# Patient Record
Sex: Female | Born: 1960 | Race: White | Hispanic: No | Marital: Single | Smoking: Never smoker
Health system: Southern US, Community
[De-identification: ages and names within clinical notes are randomized; demographics above are authoritative.]

## PROBLEM LIST (undated history)

## (undated) HISTORY — PX: TONSILLECTOMY: SUR1361

## (undated) HISTORY — PX: BREAST SURGERY: SHX581

## (undated) HISTORY — PX: PELVIC LAPAROSCOPY: SHX162

---

## 2017-12-02 ENCOUNTER — Ambulatory Visit (INDEPENDENT_AMBULATORY_CARE_PROVIDER_SITE_OTHER): Payer: 59 | Admitting: Obstetrics & Gynecology

## 2017-12-02 ENCOUNTER — Encounter: Payer: Self-pay | Admitting: Obstetrics & Gynecology

## 2017-12-02 VITALS — BP 140/88 | Ht 61.75 in | Wt 137.0 lb

## 2017-12-02 DIAGNOSIS — Z01419 Encounter for gynecological examination (general) (routine) without abnormal findings: Secondary | ICD-10-CM | POA: Diagnosis not present

## 2017-12-02 DIAGNOSIS — R7989 Other specified abnormal findings of blood chemistry: Secondary | ICD-10-CM | POA: Diagnosis not present

## 2017-12-02 DIAGNOSIS — Z13820 Encounter for screening for osteoporosis: Secondary | ICD-10-CM

## 2017-12-02 DIAGNOSIS — Z780 Asymptomatic menopausal state: Secondary | ICD-10-CM

## 2017-12-02 NOTE — Progress Notes (Signed)
Jamie Huynh 1960-11-10 [id number]   History:    57 y.o. G7P0A7 Engaged  RP:  New patient presenting for annual gyn exam   HPI: Menopause x 6 yrs, on no HRT.  No PMB.  No pelvic pain.  No pain with IC.  Feels fatigued, thinning of hair, mildly hot and weight gain in spite of intense work out at [employer].  BMI 25.26.  Health Labs done 10/03/2017.  FT3 mildly decreased at 2.8, FT4 normal at 1.12 and TSH normal at 1.55.  Mother with h/o Breast Cancer.  No genetic screening done.  Breast normal currently.  Last mammogram benign, stable in November 2018.  Per patient has a marker in right breast for benign calcifications and had two biopsies showing fibrocystic breasts on the left side.  Urine and bowel movements normal.  Health labs with family physician.  Past medical history,surgical history, family history and social history were all reviewed and documented in the EPIC chart.  Gynecologic History No LMP recorded. Patient is postmenopausal. Contraception: post menopausal status Last Pap: 2 yrs ago. Results were: normal.  Never had an abnormal Pap Last mammogram: 07/2017. Results were: normal.  Stable per patient.  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  Left benign Fibrocystic lumps removed x 2. Bone Density: Never.  Will schedule here. Colonoscopy: Never.  Will organize with Fam MD.  Obstetric History OB History  Gravida Para Term Preterm AB Living  7 0     7    SAB TAB Ectopic Multiple Live Births  7            # Outcome Date GA Lbr Len/2nd Weight Sex Delivery Anes PTL Lv  7 SAB           6 SAB           5 SAB           4 SAB           3 SAB           2 SAB           1 SAB                ROS: A ROS was performed and pertinent positives and negatives are included in the history.  GENERAL: No fevers or chills. HEENT: No change in vision, no earache, sore throat or sinus congestion. NECK: No pain or stiffness. CARDIOVASCULAR: No chest pain or pressure. No palpitations. PULMONARY: No  shortness of breath, cough or wheeze. GASTROINTESTINAL: No abdominal pain, nausea, vomiting or diarrhea, melena or bright red blood per rectum. GENITOURINARY: No urinary frequency, urgency, hesitancy or dysuria. MUSCULOSKELETAL: No joint or muscle pain, no back pain, no recent trauma. DERMATOLOGIC: No rash, no itching, no lesions. ENDOCRINE: No polyuria, polydipsia, no heat or cold intolerance. No recent change in weight. HEMATOLOGICAL: No anemia or easy bruising or bleeding. NEUROLOGIC: No headache, seizures, numbness, tingling or weakness. PSYCHIATRIC: No depression, no loss of interest in normal activity or change in sleep pattern.     Exam:   Ht 5' 1.75" (1.568 m)   Wt 137 lb (62.1 kg)   BMI 25.26 kg/m   Body mass index is 25.26 kg/m.  General appearance : Well developed well nourished female. No acute distress HEENT: Eyes: no retinal hemorrhage or exudates,  Neck supple, trachea midline, no carotid bruits, no thyroidmegaly Lungs: Clear to auscultation, no rhonchi or wheezes, or rib retractions  Heart: Regular rate and rhythm, no  murmurs or gallops Breast:Examined in sitting and supine position were symmetrical in appearance, no palpable masses or tenderness,  no skin retraction, no nipple inversion, no nipple discharge, no skin discoloration, no axillary or supraclavicular lymphadenopathy Abdomen: no palpable masses or tenderness, no rebound or guarding Extremities: no edema or skin discoloration or tenderness  Pelvic: Vulva: Normal             Vagina: No gross lesions or discharge  Cervix: No gross lesions or discharge.  Pap reflex done.  Uterus  AV, normal size, shape and consistency, non-tender and mobile  Adnexa  Without masses or tenderness  Anus: Normal   Assessment/Plan:  57 y.o. female for annual exam   1. Encounter for routine gynecological examination with Papanicolaou smear of cervix Normal gynecologic exam.  Pap reflex done today.  Breast exam normal.  Last mammogram  November 2018.  Health labs with family physician.  Will organize screening colonoscopy with family physician.  2. Menopause present Well on no hormone replacement therapy, with only mild hot flushes.  No postmenopausal bleeding.  Hormone replacement therapy not recommended at this time, patient agrees completely because of her strong family history of breast cancer.,  Will call if decides to proceed.  Phytoestrogens recommended. - VITAMIN D 25 Hydroxy (Vit-D Deficiency, Fractures)  3. Screening for osteoporosis Vitamin D supplements, calcium rich nutrition and regular weightbearing physical activity recommended.  Will schedule bone density here now.  Recommend reading The healthy bones by [place]. - VITAMIN D 25 Hydroxy (Vit-D Deficiency, Fractures)  4. Abnormal thyroid blood test Referred to endocrinology to further evaluate.  Free T3 mildly low, but rest of the workup negative.  Patient has complaints of fatigue and thinning of hair and would therefore like a more complete workup patient offered referral for genetic screening.  Other orders - Multiple Vitamin (MULTIVITAMIN) tablet; Take 1 tablet by mouth daily. - Ascorbic Acid (VITAMIN C) 1000 MG tablet; Take 1,000 mg by mouth daily. - Omega-3 Fatty Acids (FISH OIL) 600 MG CAPS; Take by mouth. - cholecalciferol (VITAMIN D) 1000 units tablet; Take 1,000 Units by mouth daily. - Calcium-Magnesium-Vitamin D (CALCIUM 1200+D3 PO); Take by mouth. - vitamin E 400 UNIT capsule; Take 400 Units by mouth daily.  Counseling on above issues more than 50% for 20 minutes.  Genia DelMarie-Lyne Toyoko Silos MD, 11:39 AM 12/02/2017

## 2017-12-03 ENCOUNTER — Encounter: Payer: Self-pay | Admitting: Obstetrics & Gynecology

## 2017-12-03 ENCOUNTER — Telehealth: Payer: Self-pay | Admitting: *Deleted

## 2017-12-03 DIAGNOSIS — R7989 Other specified abnormal findings of blood chemistry: Secondary | ICD-10-CM

## 2017-12-03 LAB — VITAMIN D 25 HYDROXY (VIT D DEFICIENCY, FRACTURES): Vit D, 25-Hydroxy: 40 ng/mL (ref 30–100)

## 2017-12-03 NOTE — Patient Instructions (Signed)
1. Encounter for routine gynecological examination with Papanicolaou smear of cervix Normal gynecologic exam.  Pap reflex done today.  Breast exam normal.  Last mammogram November 2018.  Health labs with family physician.  Will organize screening colonoscopy with family physician.  2. Menopause present Well on no hormone replacement therapy, with only mild hot flushes.  No postmenopausal bleeding.  Hormone replacement therapy not recommended at this time, patient agrees completely because of her strong family history of breast cancer.,  Will call if decides to proceed.  Phytoestrogens recommended. - VITAMIN D 25 Hydroxy (Vit-D Deficiency, Fractures)  3. Screening for osteoporosis Vitamin D supplements, calcium rich nutrition and regular weightbearing physical activity recommended.  Will schedule bone density here now.  Recommend reading The healthy bones by Claiborne Billings in [place]. - VITAMIN D 25 Hydroxy (Vit-D Deficiency, Fractures)  4. Abnormal thyroid blood test Referred to endocrinology to further evaluate.  Free T3 mildly low, but rest of the workup negative.  Patient has complaints of fatigue and thinning of hair and would therefore like a more complete workup patient offered referral for genetic screening.  Other orders - Multiple Vitamin (MULTIVITAMIN) tablet; Take 1 tablet by mouth daily. - Ascorbic Acid (VITAMIN C) 1000 MG tablet; Take 1,000 mg by mouth daily. - Omega-3 Fatty Acids (FISH OIL) 600 MG CAPS; Take by mouth. - cholecalciferol (VITAMIN D) 1000 units tablet; Take 1,000 Units by mouth daily. - Calcium-Magnesium-Vitamin D (CALCIUM 1200+D3 PO); Take by mouth. - vitamin E 400 UNIT capsule; Take 400 Units by mouth daily.  Jamie Huynh, it was a pleasure meeting you today!  I will inform you of your results as soon as they are available.   Health Maintenance for Postmenopausal Women Menopause is a normal process in which your reproductive ability comes to an end. This process happens  gradually over a span of months to years, usually between the ages of 66 and 47. Menopause is complete when you have missed 12 consecutive menstrual periods. It is important to talk with your health care provider about some of the most common conditions that affect postmenopausal women, such as heart disease, cancer, and bone loss (osteoporosis). Adopting a healthy lifestyle and getting preventive care can help to promote your health and wellness. Those actions can also lower your chances of developing some of these common conditions. What should I know about menopause? During menopause, you may experience a number of symptoms, such as:  Moderate-to-severe hot flashes.  Night sweats.  Decrease in sex drive.  Mood swings.  Headaches.  Tiredness.  Irritability.  Memory problems.  Insomnia.  Choosing to treat or not to treat menopausal changes is an individual decision that you make with your health care provider. What should I know about hormone replacement therapy and supplements? Hormone therapy products are effective for treating symptoms that are associated with menopause, such as hot flashes and night sweats. Hormone replacement carries certain risks, especially as you become older. If you are thinking about using estrogen or estrogen with progestin treatments, discuss the benefits and risks with your health care provider. What should I know about heart disease and stroke? Heart disease, heart attack, and stroke become more likely as you age. This may be due, in part, to the hormonal changes that your body experiences during menopause. These can affect how your body processes dietary fats, triglycerides, and cholesterol. Heart attack and stroke are both medical emergencies. There are many things that you can do to help prevent heart disease and stroke:  Have your  blood pressure checked at least every 1-2 years. High blood pressure causes heart disease and increases the risk of  stroke.  If you are 18-33 years old, ask your health care provider if you should take aspirin to prevent a heart attack or a stroke.  Do not use any tobacco products, including cigarettes, chewing tobacco, or electronic cigarettes. If you need help quitting, ask your health care provider.  It is important to eat a healthy diet and maintain a healthy weight. ? Be sure to include plenty of vegetables, fruits, low-fat dairy products, and lean protein. ? Avoid eating foods that are high in solid fats, added sugars, or salt (sodium).  Get regular exercise. This is one of the most important things that you can do for your health. ? Try to exercise for at least 150 minutes each week. The type of exercise that you do should increase your heart rate and make you sweat. This is known as moderate-intensity exercise. ? Try to do strengthening exercises at least twice each week. Do these in addition to the moderate-intensity exercise.  Know your numbers.Ask your health care provider to check your cholesterol and your blood glucose. Continue to have your blood tested as directed by your health care provider.  What should I know about cancer screening? There are several types of cancer. Take the following steps to reduce your risk and to catch any cancer development as early as possible. Breast Cancer  Practice breast self-awareness. ? This means understanding how your breasts normally appear and feel. ? It also means doing regular breast self-exams. Let your health care provider know about any changes, no matter how small.  If you are 6 or older, have a clinician do a breast exam (clinical breast exam or CBE) every year. Depending on your age, family history, and medical history, it may be recommended that you also have a yearly breast X-ray (mammogram).  If you have a family history of breast cancer, talk with your health care provider about genetic screening.  If you are at high risk for breast  cancer, talk with your health care provider about having an MRI and a mammogram every year.  Breast cancer (BRCA) gene test is recommended for women who have family members with BRCA-related cancers. Results of the assessment will determine the need for genetic counseling and BRCA1 and for BRCA2 testing. BRCA-related cancers include these types: ? Breast. This occurs in males or females. ? Ovarian. ? Tubal. This may also be called fallopian tube cancer. ? Cancer of the abdominal or pelvic lining (peritoneal cancer). ? Prostate. ? Pancreatic.  Cervical, Uterine, and Ovarian Cancer Your health care provider may recommend that you be screened regularly for cancer of the pelvic organs. These include your ovaries, uterus, and vagina. This screening involves a pelvic exam, which includes checking for microscopic changes to the surface of your cervix (Pap test).  For women ages 21-65, health care providers may recommend a pelvic exam and a Pap test every three years. For women ages 50-65, they may recommend the Pap test and pelvic exam, combined with testing for human papilloma virus (HPV), every five years. Some types of HPV increase your risk of cervical cancer. Testing for HPV may also be done on women of any age who have unclear Pap test results.  Other health care providers may not recommend any screening for nonpregnant women who are considered low risk for pelvic cancer and have no symptoms. Ask your health care provider if a screening  pelvic exam is right for you.  If you have had past treatment for cervical cancer or a condition that could lead to cancer, you need Pap tests and screening for cancer for at least 20 years after your treatment. If Pap tests have been discontinued for you, your risk factors (such as having a new sexual partner) need to be reassessed to determine if you should start having screenings again. Some women have medical problems that increase the chance of getting cervical  cancer. In these cases, your health care provider may recommend that you have screening and Pap tests more often.  If you have a family history of uterine cancer or ovarian cancer, talk with your health care provider about genetic screening.  If you have vaginal bleeding after reaching menopause, tell your health care provider.  There are currently no reliable tests available to screen for ovarian cancer.  Lung Cancer Lung cancer screening is recommended for adults 43-36 years old who are at high risk for lung cancer because of a history of smoking. A yearly low-dose CT scan of the lungs is recommended if you:  Currently smoke.  Have a history of at least 30 pack-years of smoking and you currently smoke or have quit within the past 15 years. A pack-year is smoking an average of one pack of cigarettes per day for one year.  Yearly screening should:  Continue until it has been 15 years since you quit.  Stop if you develop a health problem that would prevent you from having lung cancer treatment.  Colorectal Cancer  This type of cancer can be detected and can often be prevented.  Routine colorectal cancer screening usually begins at age 28 and continues through age 79.  If you have risk factors for colon cancer, your health care provider may recommend that you be screened at an earlier age.  If you have a family history of colorectal cancer, talk with your health care provider about genetic screening.  Your health care provider may also recommend using home test kits to check for hidden blood in your stool.  A small camera at the end of a tube can be used to examine your colon directly (sigmoidoscopy or colonoscopy). This is done to check for the earliest forms of colorectal cancer.  Direct examination of the colon should be repeated every 5-10 years until age 20. However, if early forms of precancerous polyps or small growths are found or if you have a family history or genetic risk  for colorectal cancer, you may need to be screened more often.  Skin Cancer  Check your skin from head to toe regularly.  Monitor any moles. Be sure to tell your health care provider: ? About any new moles or changes in moles, especially if there is a change in a mole's shape or color. ? If you have a mole that is larger than the size of a pencil eraser.  If any of your family members has a history of skin cancer, especially at a young age, talk with your health care provider about genetic screening.  Always use sunscreen. Apply sunscreen liberally and repeatedly throughout the day.  Whenever you are outside, protect yourself by wearing long sleeves, pants, a wide-brimmed hat, and sunglasses.  What should I know about osteoporosis? Osteoporosis is a condition in which bone destruction happens more quickly than new bone creation. After menopause, you may be at an increased risk for osteoporosis. To help prevent osteoporosis or the bone fractures that can  happen because of osteoporosis, the following is recommended:  If you are 37-29 years old, get at least 1,000 mg of calcium and at least 600 mg of vitamin D per day.  If you are older than age 70 but younger than age 92, get at least 1,200 mg of calcium and at least 600 mg of vitamin D per day.  If you are older than age 28, get at least 1,200 mg of calcium and at least 800 mg of vitamin D per day.  Smoking and excessive alcohol intake increase the risk of osteoporosis. Eat foods that are rich in calcium and vitamin D, and do weight-bearing exercises several times each week as directed by your health care provider. What should I know about how menopause affects my mental health? Depression may occur at any age, but it is more common as you become older. Common symptoms of depression include:  Low or sad mood.  Changes in sleep patterns.  Changes in appetite or eating patterns.  Feeling an overall lack of motivation or enjoyment of  activities that you previously enjoyed.  Frequent crying spells.  Talk with your health care provider if you think that you are experiencing depression. What should I know about immunizations? It is important that you get and maintain your immunizations. These include:  Tetanus, diphtheria, and pertussis (Tdap) booster vaccine.  Influenza every year before the flu season begins.  Pneumonia vaccine.  Shingles vaccine.  Your health care provider may also recommend other immunizations. This information is not intended to replace advice given to you by your health care provider. Make sure you discuss any questions you have with your health care provider. Document Released: 11/02/2005 Document Revised: 03/30/2016 Document Reviewed: 06/14/2015 Elsevier Interactive Patient Education  2018 [employer].

## 2017-12-03 NOTE — Telephone Encounter (Signed)
-----   Message from Genia DelMarie-Lyne Lavoie, MD sent at 12/02/2017 12:28 PM EDT ----- Regarding: Refer to Endocrinologist Fatigue, thinning of hair, weight gain with FT3 mildly low at 2.8.  Refer to Endocrinologist.

## 2017-12-03 NOTE — Telephone Encounter (Signed)
Referral placed at  [facility],they will call pt to schedule.

## 2017-12-04 LAB — PAP IG W/ RFLX HPV ASCU

## 2018-01-17 NOTE — Telephone Encounter (Signed)
[facility] declined referral per Dr. Lucianne MussKumar states after reviewing notes it doesn't appear to be an endocrinology issue. What to recommend to patient? Please advise

## 2018-01-20 NOTE — Telephone Encounter (Signed)
Recommend to see her Family MD.

## 2018-01-21 NOTE — Telephone Encounter (Signed)
Left detailed message on cell per DPR access. 

## 2019-01-19 ENCOUNTER — Other Ambulatory Visit: Payer: Self-pay

## 2019-01-21 ENCOUNTER — Encounter: Payer: Self-pay | Admitting: Obstetrics & Gynecology

## 2019-01-21 ENCOUNTER — Ambulatory Visit (INDEPENDENT_AMBULATORY_CARE_PROVIDER_SITE_OTHER): Payer: 59 | Admitting: Obstetrics & Gynecology

## 2019-01-21 ENCOUNTER — Other Ambulatory Visit: Payer: Self-pay

## 2019-01-21 VITALS — BP 150/96 | Ht 61.75 in | Wt 133.0 lb

## 2019-01-21 DIAGNOSIS — Z780 Asymptomatic menopausal state: Secondary | ICD-10-CM

## 2019-01-21 DIAGNOSIS — Z01419 Encounter for gynecological examination (general) (routine) without abnormal findings: Secondary | ICD-10-CM

## 2019-01-21 DIAGNOSIS — I10 Essential (primary) hypertension: Secondary | ICD-10-CM

## 2019-01-21 NOTE — Progress Notes (Signed)
Jamie Huynh 10-Nov-1960 [id number]   History:    58 y.o. G7P0A7 Single (Broke up with fiance)  RP:  Established patient presenting for annual gyn exam   HPI: Menopause, well on no HRT.  No PMB.  No pelvic pain.  Abstinent currently.  Urine and bowel movements normal.  Breast normal.  Body mass index 24.58.  We will follow-up here for fasting health labs.  Past medical history,surgical history, family history and social history were all reviewed and documented in the EPIC chart.  Gynecologic History No LMP recorded. Patient is postmenopausal. Contraception: post menopausal status Last Pap: 11/2017. Results were: Negative Last mammogram: 2018. Results were: normal.  Will schedule screening Mammo now. Bone Density: Never Colonoscopy: Will organize a colonoscopy.  Obstetric History OB History  Gravida Para Term Preterm AB Living  7 0     7    SAB TAB Ectopic Multiple Live Births  7            # Outcome Date GA Lbr Len/2nd Weight Sex Delivery Anes PTL Lv  7 SAB           6 SAB           5 SAB           4 SAB           3 SAB           2 SAB           1 SAB              ROS: A ROS was performed and pertinent positives and negatives are included in the history.  GENERAL: No fevers or chills. HEENT: No change in vision, no earache, sore throat or sinus congestion. NECK: No pain or stiffness. CARDIOVASCULAR: No chest pain or pressure. No palpitations. PULMONARY: No shortness of breath, cough or wheeze. GASTROINTESTINAL: No abdominal pain, nausea, vomiting or diarrhea, melena or bright red blood per rectum. GENITOURINARY: No urinary frequency, urgency, hesitancy or dysuria. MUSCULOSKELETAL: No joint or muscle pain, no back pain, no recent trauma. DERMATOLOGIC: No rash, no itching, no lesions. ENDOCRINE: No polyuria, polydipsia, no heat or cold intolerance. No recent change in weight. HEMATOLOGICAL: No anemia or easy bruising or bleeding. NEUROLOGIC: No headache, seizures, numbness,  tingling or weakness. PSYCHIATRIC: No depression, no loss of interest in normal activity or change in sleep pattern.     Exam:   BP (!) 150/96   Ht 5' 1.75" (1.568 m)   Wt 133 lb (60.3 kg)   BMI 24.52 kg/m   Body mass index is 24.52 kg/m.  General appearance : Well developed well nourished female. No acute distress HEENT: Eyes: no retinal hemorrhage or exudates,  Neck supple, trachea midline, no carotid bruits, no thyroidmegaly Lungs: Clear to auscultation, no rhonchi or wheezes, or rib retractions  Heart: Regular rate and rhythm, no murmurs or gallops Breast:Examined in sitting and supine position were symmetrical in appearance, no palpable masses or tenderness,  no skin retraction, no nipple inversion, no nipple discharge, no skin discoloration, no axillary or supraclavicular lymphadenopathy Abdomen: no palpable masses or tenderness, no rebound or guarding Extremities: no edema or skin discoloration or tenderness  Pelvic: Vulva: Normal             Vagina: No gross lesions or discharge  Cervix: No gross lesions or discharge.  Pap reflex done.  Uterus  AV, normal size, shape and consistency, non-tender and mobile  Adnexa  Without masses or tenderness  Anus: Normal   Assessment/Plan:  58 y.o. female for annual exam   1. Encounter for routine gynecological examination with Papanicolaou smear of cervix Normal gynecologic exam.  Pap reflex done.  Breast exam normal.  Schedule screening mammogram now.  Schedule screening colonoscopy.  Follow-up here for fasting health labs.  Good body mass index at 24.52.  Recommend increasing physical activities. - CBC; Future - Comp Met (CMET); Future - TSH; Future - Lipid panel; Future - VITAMIN D 25 Hydroxy (Vit-D Deficiency, Fractures); Future  2. Menopause Well on no hormone replacement therapy.  No postmenopausal bleeding.  Recommend vitamin D supplements with calcium intake of 1500 mg daily and regular weightbearing physical activities.   3. Hypertension, unspecified type High blood pressure today at 150/96.  Recommend to recheck blood pressure and if remains high will need a referral to a family physician.  Low-salt diet recommended.  Princess Bruins MD, 1:16 PM 01/21/2019

## 2019-01-22 ENCOUNTER — Encounter: Payer: Self-pay | Admitting: Obstetrics & Gynecology

## 2019-01-22 LAB — PAP IG W/ RFLX HPV ASCU

## 2019-01-22 NOTE — Patient Instructions (Addendum)
1. Encounter for routine gynecological examination with Papanicolaou smear of cervix Normal gynecologic exam.  Pap reflex done.  Breast exam normal.  Schedule screening mammogram now.  Schedule screening colonoscopy.  Follow-up here for fasting health labs.  Good body mass index at 24.52.  Recommend increasing physical activities. - CBC; Future - Comp Met (CMET); Future - TSH; Future - Lipid panel; Future - VITAMIN D 25 Hydroxy (Vit-D Deficiency, Fractures); Future  2. Menopause Well on no hormone replacement therapy.  No postmenopausal bleeding.  Recommend vitamin D supplements with calcium intake of 1500 mg daily and regular weightbearing physical activities.  3. Hypertension, unspecified type High blood pressure today at 150/96.  Recommend to recheck blood pressure and if remains high will need a referral to a family physician.  Low-salt diet recommended.  Jamie Huynh, it was a pleasure seeing you today!  I will inform you of your results as soon as they are available.

## 2020-02-09 ENCOUNTER — Telehealth: Payer: Self-pay | Admitting: *Deleted

## 2020-02-09 DIAGNOSIS — Z13220 Encounter for screening for lipoid disorders: Secondary | ICD-10-CM

## 2020-02-09 DIAGNOSIS — Z01419 Encounter for gynecological examination (general) (routine) without abnormal findings: Secondary | ICD-10-CM

## 2020-02-09 DIAGNOSIS — Z1329 Encounter for screening for other suspected endocrine disorder: Secondary | ICD-10-CM

## 2020-02-09 DIAGNOSIS — Z1321 Encounter for screening for nutritional disorder: Secondary | ICD-10-CM

## 2020-02-09 NOTE — Telephone Encounter (Signed)
Yes, agree

## 2020-02-09 NOTE — Telephone Encounter (Signed)
Patient called requesting referral from [facility], report last year she had labs drawn at [facility] with abnormal TSH level,( labs scanned into chart)  has noticed hair loss and increase weight gain. Okay to place referral?

## 2020-02-10 NOTE — Telephone Encounter (Signed)
Left message for patient to call.

## 2020-02-10 NOTE — Telephone Encounter (Addendum)
I discussed with patient [facility] labs are [facility] will need abnormal labs for referral. Patinet thought she received labs with PCP, that were abnormal . Per office visit on 01/21/19 annual labs in visit, but never ordered. I told patient I will place labs orders and she needs to schedule annual exam and we can use these orders for annual exam this year. Patient will call to schedule lab appointment. Patient verbalized she understood and will call me when she is ready for referral.

## 2020-02-15 ENCOUNTER — Other Ambulatory Visit: Payer: 59

## 2020-02-15 ENCOUNTER — Other Ambulatory Visit: Payer: Self-pay

## 2020-02-15 ENCOUNTER — Other Ambulatory Visit: Payer: Self-pay | Admitting: Anesthesiology

## 2020-02-15 DIAGNOSIS — Z1329 Encounter for screening for other suspected endocrine disorder: Secondary | ICD-10-CM

## 2020-02-15 DIAGNOSIS — L659 Nonscarring hair loss, unspecified: Secondary | ICD-10-CM

## 2020-02-15 DIAGNOSIS — Z13220 Encounter for screening for lipoid disorders: Secondary | ICD-10-CM

## 2020-02-15 DIAGNOSIS — R635 Abnormal weight gain: Secondary | ICD-10-CM

## 2020-02-15 DIAGNOSIS — Z01419 Encounter for gynecological examination (general) (routine) without abnormal findings: Secondary | ICD-10-CM

## 2020-02-15 DIAGNOSIS — Z1321 Encounter for screening for nutritional disorder: Secondary | ICD-10-CM

## 2020-02-15 LAB — COMPREHENSIVE METABOLIC PANEL
AG Ratio: 2.2 (calc) (ref 1.0–2.5)
ALT: 17 U/L (ref 6–29)
AST: 17 U/L (ref 10–35)
Albumin: 4.6 g/dL (ref 3.6–5.1)
Alkaline phosphatase (APISO): 78 U/L (ref 37–153)
BUN: 18 mg/dL (ref 7–25)
CO2: 26 mmol/L (ref 20–32)
Calcium: 9.8 mg/dL (ref 8.6–10.4)
Chloride: 107 mmol/L (ref 98–110)
Creat: 0.62 mg/dL (ref 0.50–0.99)
Globulin: 2.1 g/dL (calc) (ref 1.9–3.7)
Glucose, Bld: 96 mg/dL (ref 65–99)
Potassium: 4.4 mmol/L (ref 3.5–5.3)
Sodium: 140 mmol/L (ref 135–146)
Total Bilirubin: 0.5 mg/dL (ref 0.2–1.2)
Total Protein: 6.7 g/dL (ref 6.1–8.1)

## 2020-02-15 LAB — CBC
HCT: 41.7 % (ref 35.0–45.0)
Hemoglobin: 13.6 g/dL (ref 11.7–15.5)
MCH: 30 pg (ref 27.0–33.0)
MCHC: 32.6 g/dL (ref 32.0–36.0)
MCV: 92.1 fL (ref 80.0–100.0)
MPV: 10.6 fL (ref 7.5–12.5)
Platelets: 251 10*3/uL (ref 140–400)
RBC: 4.53 10*6/uL (ref 3.80–5.10)
RDW: 13.1 % (ref 11.0–15.0)
WBC: 4.9 10*3/uL (ref 3.8–10.8)

## 2020-02-15 LAB — VITAMIN D 25 HYDROXY (VIT D DEFICIENCY, FRACTURES): Vit D, 25-Hydroxy: 25 ng/mL — ABNORMAL LOW (ref 30–100)

## 2020-02-15 LAB — LIPID PANEL
Cholesterol: 227 mg/dL — ABNORMAL HIGH (ref <200)
HDL: 96 mg/dL (ref >=50)
LDL Cholesterol (Calc): 116 mg/dL (calc) — ABNORMAL HIGH
Non-HDL Cholesterol (Calc): 131 mg/dL (calc) — ABNORMAL HIGH (ref <130)
Total CHOL/HDL Ratio: 2.4 (calc) (ref <5.0)
Triglycerides: 55 mg/dL (ref <150)

## 2020-02-15 LAB — TSH: TSH: 0.61 mIU/L (ref 0.40–4.50)

## 2020-02-19 LAB — CORTISOL: Cortisol, Plasma: 8.1 ug/dL

## 2020-02-19 LAB — DIHYDROTESTOSTERONE: Dihydrotestosterone LC/MS/MS: <5 ng/dL (ref <=20)

## 2020-07-11 ENCOUNTER — Encounter: Payer: Self-pay | Admitting: Obstetrics & Gynecology

## 2020-07-11 ENCOUNTER — Other Ambulatory Visit: Payer: Self-pay

## 2020-07-11 ENCOUNTER — Ambulatory Visit (INDEPENDENT_AMBULATORY_CARE_PROVIDER_SITE_OTHER): Payer: 59 | Admitting: Obstetrics & Gynecology

## 2020-07-11 VITALS — BP 130/86 | Ht 63.0 in | Wt 130.0 lb

## 2020-07-11 DIAGNOSIS — Z01419 Encounter for gynecological examination (general) (routine) without abnormal findings: Secondary | ICD-10-CM | POA: Diagnosis not present

## 2020-07-11 DIAGNOSIS — Z13820 Encounter for screening for osteoporosis: Secondary | ICD-10-CM

## 2020-07-11 DIAGNOSIS — Z780 Asymptomatic menopausal state: Secondary | ICD-10-CM

## 2020-07-11 NOTE — Progress Notes (Signed)
Jamie Huynh November 05, 1960 [id number]   History:    59 y.o. G7P0A7 Single.  Parents in [place], would like to move there.  Working from home.  RP:  Established patient presenting for annual gyn exam   HPI: Postmenopause, well on no HRT.  No PMB.  No pelvic pain.  Abstinent currently.  Urine and bowel movements normal.  Breast normal.  Body mass index 23.03.  Will follow-up here for fasting Health labs in 01/2021.  Planning to do Cologard.  F/U BD here.  Past medical history,surgical history, family history and social history were all reviewed and documented in the EPIC chart.  Gynecologic History No LMP recorded. Patient is postmenopausal.  Obstetric History OB History  Gravida Para Term Preterm AB Living  7 0     7    SAB TAB Ectopic Multiple Live Births  7            # Outcome Date GA Lbr Len/2nd Weight Sex Delivery Anes PTL Lv  7 SAB           6 SAB           5 SAB           4 SAB           3 SAB           2 SAB           1 SAB              ROS: A ROS was performed and pertinent positives and negatives are included in the history.  GENERAL: No fevers or chills. HEENT: No change in vision, no earache, sore throat or sinus congestion. NECK: No pain or stiffness. CARDIOVASCULAR: No chest pain or pressure. No palpitations. PULMONARY: No shortness of breath, cough or wheeze. GASTROINTESTINAL: No abdominal pain, nausea, vomiting or diarrhea, melena or bright red blood per rectum. GENITOURINARY: No urinary frequency, urgency, hesitancy or dysuria. MUSCULOSKELETAL: No joint or muscle pain, no back pain, no recent trauma. DERMATOLOGIC: No rash, no itching, no lesions. ENDOCRINE: No polyuria, polydipsia, no heat or cold intolerance. No recent change in weight. HEMATOLOGICAL: No anemia or easy bruising or bleeding. NEUROLOGIC: No headache, seizures, numbness, tingling or weakness. PSYCHIATRIC: No depression, no loss of interest in normal activity or change in sleep pattern.      Exam:   BP 130/86   Ht 5\' 3"  (1.6 m)   Wt 130 lb (59 kg)   BMI 23.03 kg/m   Body mass index is 23.03 kg/m.  General appearance : Well developed well nourished female. No acute distress HEENT: Eyes: no retinal hemorrhage or exudates,  Neck supple, trachea midline, no carotid bruits, no thyroidmegaly Lungs: Clear to auscultation, no rhonchi or wheezes, or rib retractions  Heart: Regular rate and rhythm, no murmurs or gallops Breast:Examined in sitting and supine position were symmetrical in appearance, no palpable masses or tenderness,  no skin retraction, no nipple inversion, no nipple discharge, no skin discoloration, no axillary or supraclavicular lymphadenopathy Abdomen: no palpable masses or tenderness, no rebound or guarding Extremities: no edema or skin discoloration or tenderness  Pelvic: Vulva: Normal             Vagina: No gross lesions or discharge  Cervix: No gross lesions or discharge  Uterus  AV, normal size, shape and consistency, non-tender and mobile  Adnexa  Without masses or tenderness  Anus: Normal   Assessment/Plan:  59 y.o. female for  annual exam   1. Well female exam with routine gynecological exam Normal gynecologic exam in menopause.  No indication to repeat a Pap test this year, Pap test neg 12/2018.  Breast exam normal.  Screening mammogram October 2021 was negative.  Will do Cologuard now.  Follow-up here for fasting health labs May 2022 at 1 year.  Good body mass index at 23.03.  Continue with fitness and healthy nutrition.  2. Postmenopause Well on no hormone replacement therapy.  No postmenopausal bleeding.  3. Screening for osteoporosis Will follow-up here for her first bone density.  Vitamin D supplements, calcium intake of 1500 mg daily and regular weightbearing physical activities. - DG Bone Density; Future  [name], 4:02 PM 07/11/2020

## 2020-11-07 NOTE — Progress Notes (Signed)
I, Christoper Fabian, LAT, ATC, am serving as scribe for Dr. Clementeen Graham.  Subjective:    CC: Right ankle pain  HPI: Pt is a 60 y/o female c/o R ankle pain x 18 months after she began walking for exercise, approximately 20 miles/week.  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  She had a steroid injection and was advised to use a boot for 2 weeks.  Pt locates R ankle pain to her R lateral, distal lower leg, lateral ankle and lateral foot.  She's not interested in injections or surgery.  Radiating pain: yes into her lateral, distal lower leg and lateral foot R ankle swelling: yes Aggravates: prolonged walking; R ankle AROM (at times) Rx tried: podiatry w/ a steroid injection; chiro; different shoes  Pertinent review of Systems: No fevers or chills  Relevant historical information: Palpitations   Objective:    Vitals:   11/08/20 1503  BP: 140/90  Pulse: 79  SpO2: 98%   General: Well Developed, well nourished, and in no acute distress.   MSK: Right foot and ankle.  Cavus foot otherwise normal-appearing Tender palpation at ATFL region.   Tender palpation at the course of the peroneal tendon just inferior to lateral malleolus. Decreased ankle motion Pain resisted foot eversion. Stable ligamentous exam. Pulses cap refill and sensation are intact distally.  Lab and Radiology Results  X-ray images obtained today of the right ankle personally and independently interpreted DJD lateral ankle.  No acute fractures visible. Await formal radiology review  Diagnostic Limited MSK Ultrasound of: Right ankle DJD narrowing lateral ankle with a small joint effusion. Significant tenosynovitis and peroneal tendon especially at area inferior to bilateral malleolus.  The peroneal longus tendon appears to be disrupted with partial tendon tear.  No full-thickness complete tear of the peroneal tendon visible. Impression: Lateral ankle DJD and  peroneal tenosynovitis with partial tear    Impression and Recommendations:    Assessment and Plan: 60 y.o. female with right lateral ankle pain.  Primarily due to DJD and peroneal tenosynovitis and partial tear.  Patient also has a cavus foot which puts her at increased risk for injury.  Plan for home exercise program and physical therapy.  Also recommend Voltaren gel and compressive ankle sleeve.  Recheck in 6 weeks.  Consider MRI in future if not better.Marland Kitchen  PDMP not reviewed this encounter. Orders Placed This Encounter  Procedures  . DG Ankle Complete Right    Standing Status:   Future    Number of Occurrences:   1    Standing Expiration Date:   12/06/2020    Order Specific Question:   Reason for Exam (SYMPTOM  OR DIAGNOSIS REQUIRED)    Answer:   R ankle pain    Order Specific Question:   Preferred imaging location?    Answer:   [facility]  . [place] LIMITED JOINT SPACE STRUCTURES LOW RIGHT(NO LINKED CHARGES)    Order Specific Question:   Reason for Exam (SYMPTOM  OR DIAGNOSIS REQUIRED)    Answer:   R ankle pain    Order Specific Question:   Preferred imaging location?    Answer:   Adult nurse [facility]  . Ambulatory referral to Physical Therapy    Referral Priority:   Routine    Referral Type:   Physical Medicine    Referral Reason:   Specialty Services Required    Requested Specialty:   Physical Therapy  No orders of the defined types were placed in this encounter.   Discussed warning signs or symptoms. Please see discharge instructions. Patient expresses understanding.   The above documentation has been reviewed and is accurate and complete Lynne Leader, M.D.

## 2020-11-08 ENCOUNTER — Other Ambulatory Visit: Payer: Self-pay

## 2020-11-08 ENCOUNTER — Encounter: Payer: Self-pay | Admitting: Family Medicine

## 2020-11-08 ENCOUNTER — Ambulatory Visit: Payer: Self-pay

## 2020-11-08 ENCOUNTER — Ambulatory Visit (INDEPENDENT_AMBULATORY_CARE_PROVIDER_SITE_OTHER): Payer: 59 | Admitting: Family Medicine

## 2020-11-08 ENCOUNTER — Ambulatory Visit (INDEPENDENT_AMBULATORY_CARE_PROVIDER_SITE_OTHER): Payer: 59

## 2020-11-08 VITALS — BP 140/90 | HR 79 | Ht 63.0 in | Wt 132.2 lb

## 2020-11-08 DIAGNOSIS — M25571 Pain in right ankle and joints of right foot: Secondary | ICD-10-CM

## 2020-11-08 DIAGNOSIS — G8929 Other chronic pain: Secondary | ICD-10-CM

## 2020-11-08 DIAGNOSIS — M7671 Peroneal tendinitis, right leg: Secondary | ICD-10-CM | POA: Diagnosis not present

## 2020-11-08 NOTE — Patient Instructions (Addendum)
Thank you for coming in today.  Please use voltaren gel up to 4x daily for pain as needed.   Please perform the exercise program that we have prepared for you and gone over in detail on a daily basis.  In addition to the handout you were provided you can access your program through: [email or web address]   Your unique program code is: [email or web address]   I've referred you to Physical Therapy.  Let us know if you don't hear from them in one week.  I recommend you obtained a compression sleeve to help with your joint problems. There are many options on the market however I recommend obtaining a full ankle Body Helix compression sleeve.  You can find information (including how to appropriate measure yourself for sizing) can be found at www.Body Grand[employer].ch.  Many of these products are health savings account (HSA) eligible.   You can use the compression sleeve at any time throughout the day but is most important to use while being active as well as for 2 hours post-activity.   It is appropriate to ice following activity with the compression sleeve in place.  Recheck in 6 weeks.   Let me know if you have a problem.

## 2020-11-10 NOTE — Progress Notes (Signed)
X-ray right ankle shows evidence of prior old fractures but no current fracture.

## 2020-11-15 ENCOUNTER — Ambulatory Visit (INDEPENDENT_AMBULATORY_CARE_PROVIDER_SITE_OTHER): Payer: 59 | Admitting: Physical Therapy

## 2020-11-15 ENCOUNTER — Encounter: Payer: Self-pay | Admitting: Physical Therapy

## 2020-11-15 ENCOUNTER — Other Ambulatory Visit: Payer: Self-pay

## 2020-11-15 DIAGNOSIS — M25571 Pain in right ankle and joints of right foot: Secondary | ICD-10-CM | POA: Diagnosis not present

## 2020-11-15 DIAGNOSIS — M25471 Effusion, right ankle: Secondary | ICD-10-CM

## 2020-11-15 DIAGNOSIS — M6281 Muscle weakness (generalized): Secondary | ICD-10-CM | POA: Diagnosis not present

## 2020-11-15 DIAGNOSIS — M25671 Stiffness of right ankle, not elsewhere classified: Secondary | ICD-10-CM

## 2020-11-15 NOTE — Patient Instructions (Signed)
Access Code: [email or web address] URL: [email or web address] Date: 11/15/2020 Prepared by: Zebedee Iba  Exercises Heel Raise on Step - 2 x daily - 7 x weekly - 2 sets - 10 reps Step Up - 2 x daily - 7 x weekly - 2 sets - 10 reps Single Leg Stance - 2 x daily - 7 x weekly - 1 sets - 3 reps - 30 hold

## 2020-11-15 NOTE — Therapy (Signed)
[facility] [address] [place], [place], [address] Phone: [phone]   Fax:  [phone]  Physical Therapy Evaluation  Patient Details  Name: Jamie Huynh MRN: [id number] Date of Birth: [birth year] Referring Provider (PT): Dr. Georgina Snell   Encounter Date: 11/15/2020   PT End of Session - 11/15/20 1641    Visit Number 1    Number of Visits 9    Date for PT Re-Evaluation 12/15/20    Authorization Type [employer] [facility]    PT Start Time 1520    PT Stop Time 1884    PT Time Calculation (min) 55 min    Activity Tolerance Patient tolerated treatment well;No increased pain    Behavior During Therapy [facility] for tasks assessed/performed           History reviewed. No pertinent past medical history.  Past Surgical History:  Procedure Laterality Date  . BREAST SURGERY     2 lumpectomies of left breast   . PELVIC LAPAROSCOPY     fibroid   . TONSILLECTOMY      There were no vitals filed for this visit.    Subjective Assessment - 11/15/20 1524    Subjective Pt states that during the pandemic she started walking about 20 miles a week. She was wearing a running sneaker while walking. She reports have very high arches due to history of classical dancing. She states in 2020 her ankle would swell from the walking and in Feb 2021, the pain became even worse. She states the pain currently feels like hte foot "doesn't want to work." She states she switched to new [employer] shoes that have improved the pain significantly. She states the ankle currently feels weak. She states she felt the tendon tear occur while shoveling snow in [place] where she felt something give way in the moment, about 3-4 weeks ago. She states the [place] imaging shows the tear as well. She states there was a prior fibular fx in the past well that she did not know about. Pt states she currently gets fibularis group cramps in the middle of the night. She states the pain is up in the lateral aspect of the  foot and leg. She is currently only doing exercises from the MD.  She used to use a spin bike and ocassional resistance training, but has stopped due to work and pain. Pt denies NT. She states the SL balance is really painful and the strength is not there.    Limitations Standing;Walking    How long can you walk comfortably? 1 mile    Patient Stated Goals Pt state s she wants to get back to exercise without pain at all.    Currently in Pain? Yes    Pain Score 4     Pain Location Leg    Pain Orientation Right    Pain Descriptors / Indicators Aching    Pain Type Chronic pain;Acute pain    Pain Radiating Towards up towards the knee    Pain Onset More than a month ago    Pain Frequency Intermittent    Aggravating Factors  standing, walking, stairs, pressure on it    Pain Relieving Factors resting    Effect of Pain on Daily Activities difficulty with daily mobility    Multiple Pain Sites Yes              [facility] PT Assessment - 11/15/20 0001      Assessment   Medical Diagnosis R fibularis strain  Referring Provider (PT) Dr. Georgina Snell    Prior Therapy N/A      Precautions   Precautions None      Restrictions   Weight Bearing Restrictions No      Balance Screen   Has the patient fallen in the past 6 months No    Has the patient had a decrease in activity level because of a fear of falling?  No    Is the patient reluctant to leave their home because of a fear of falling?  No      Home Ecologist residence    Living Arrangements Alone      Prior Function   Level of Independence Independent      Cognition   Overall Cognitive Status Within Functional Limits for tasks assessed      Observation/Other Assessments   Other Surveys  Lower Extremity Functional Scale    Lower Extremity Functional Scale  65% Function      Functional Tests   Functional tests Squat;Step up      Squat   Comments offweighting of R ankle, decreased ankle ROM      Step Up    Comments WFL      ROM / Strength   AROM / PROM / Strength AROM;PROM;Strength      AROM   Overall AROM Comments Plantarflexion WNL, DF limited to -2 deg on R    AROM Assessment Site Ankle    Right/Left Ankle Right    Right Ankle Inversion 20    Right Ankle Eversion 10      PROM   Overall PROM Comments limited DF, INV and EV limited due to pain      Strength   Strength Assessment Site Ankle    Right/Left Ankle Right    Right Ankle Dorsiflexion 5/5    Right Ankle Plantar Flexion 4+/5    Right Ankle Inversion 4/5    Right Ankle Eversion --   not tested due to pain     Palpation   Palpation comment TTP fibularis group mid substance and at brevis insertion, lateral aspect of cuboid and region proximal tof 5th met      Special Tests    Special Tests Ankle/Foot Special Tests    Ankle/Foot Special Tests  Anterior Drawer Test      Anterior Drawer Test   Findings Negative      Ambulation/Gait   Gait Pattern Decreased dorsiflexion - right;Decreased dorsiflexion - left    Stairs Yes    Stair Management Technique Alternating pattern;One rail Right          Foot/Ankle Observation: Pes cavus No excessive pronation with weight shift or SLS Walks with increased inversion during heel strike            Objective measurements completed on examination: See above findings.       [facility] PT Treatment/Exercise - 11/15/20 0001      Exercises   Exercises Ankle      Manual Therapy   Manual Therapy Soft tissue mobilization;Joint mobilization    Joint Mobilization TC post glide grade II-III R ankle    Soft tissue mobilization R fibularis group muscle bellies      Ankle Exercises: Standing   SLS 30s 3x flat ground    Heel Raises 20 reps;3 seconds    Heel Raises Limitations 2x10 off stair, pain free ROM    Other Standing Ankle Exercises stair step up 6" 2x10    Other  Standing Ankle Exercises banded ankle self TC glide 3 min                  PT Education -  11/15/20 1638    Education Details MOI, diagnosis, prognosis, joint protection, anatomy, footwear selection, HEP    Person(s) Educated Patient    Methods Explanation;Demonstration;Handout    Comprehension Verbalized understanding;Returned demonstration            PT Short Term Goals - 11/15/20 1654      PT SHORT TERM GOAL #1   Title Pt will become independent with HEP in order to demonstrate synthesis of PT education.    Time 2    Period Weeks    Status New      PT SHORT TERM GOAL #2   Title Pt will demonstrate ability to maintain SLS for >30s in order to demonstrate functional improvement in ankle motor control.    Time 4    Period Weeks    Status New             PT Long Term Goals - 11/15/20 1656      PT LONG TERM GOAL #1   Title Pt will demonstrate >9 point improvement on LEFS in order to demonstrate clinically significant improvement in LE function.    Time 6    Period Weeks    Status New      PT LONG TERM GOAL #2   Title Pt will be able to demonstrate ability to perform plyometric jumping and landing movements in order to demonstrate functional improvement in R ankle stability and motor control for full return to PLOF.    Time 8    Period Weeks    Status New                  Plan - 11/15/20 1642    Clinical Impression Statement Pt is a 60 y.o. female presenting to PT eval today with CC of R lateral ankle/foot pain. Pt presents with decreased R ankle ROM, increased muscle guarding/spasm, increased edema, and ankle weakness. Pt's s/s are consistent with fibularis group strain and inflammation, as also conferred by US imaging. Due to pt's foot/ankle anatomy, pt has likely been continuing to apply tensile stress along the lateral side of her ankle until the trialing of her new footwear. Clinical findings do not suggest lateral ligamentous damage at this time. Pt's impairments limit her ability to perform daily mobility and physical exercise. Pt would benefit  from continued skilled therapy in order to maximize functional ankle stability and strength for return to full PLOF.    Personal Factors and Comorbidities Time since onset of injury/illness/exacerbation    Examination-Activity Limitations Squat;Stairs;Stand;Transfers;Other;Lift    Examination-Participation Restrictions Other;Yard Work;Community Activity    Stability/Clinical Decision Making Stable/Uncomplicated    Clinical Decision Making Low    Rehab Potential Good    PT Frequency 1x / week    PT Duration 8 weeks    PT Treatment/Interventions ADLs/Self Care Home Management;Aquatic Therapy;Biofeedback;Electrical Stimulation;Cryotherapy;Iontophoresis 39m/ml Dexamethasone;Moist Heat;Ultrasound;Gait training;Stair training;Functional mobility training;Therapeutic activities;Therapeutic exercise;Balance training;Neuromuscular re-education;Patient/family education;Orthotic Fit/Training;Manual techniques;Passive range of motion;Dry needling;Taping;Joint Manipulations    PT Next Visit Plan Review HEP, TC posterior mob, STM fibularis, lateral step up, increased 4 way ankle band resistance    PT Home Exercise Plan Handout provided    Consulted and Agree with Plan of Care Patient           Patient will benefit from skilled therapeutic intervention in order to  improve the following deficits and impairments:  Decreased balance,Decreased mobility,Difficulty walking,Increased muscle spasms,Decreased range of motion,Pain,Decreased strength,Decreased activity tolerance  Visit Diagnosis: Ankle stiff, right  Pain in joint of right ankle  Right ankle swelling  Muscle weakness (generalized)     Problem List There are no problems to display for this patient.   Daleen Bo PT, DPT 11/15/20 5:15 PM   [facility] [address] [place], [place], [address] Phone: [phone]   Fax:  [phone]  Name: Jamie Huynh MRN: [id number] Date of Birth:  [birth year]

## 2020-11-22 ENCOUNTER — Ambulatory Visit (INDEPENDENT_AMBULATORY_CARE_PROVIDER_SITE_OTHER): Payer: 59 | Admitting: Physical Therapy

## 2020-11-22 ENCOUNTER — Other Ambulatory Visit: Payer: Self-pay

## 2020-11-22 ENCOUNTER — Encounter: Payer: Self-pay | Admitting: Physical Therapy

## 2020-11-22 DIAGNOSIS — M25571 Pain in right ankle and joints of right foot: Secondary | ICD-10-CM | POA: Diagnosis not present

## 2020-11-22 DIAGNOSIS — M25471 Effusion, right ankle: Secondary | ICD-10-CM | POA: Diagnosis not present

## 2020-11-22 DIAGNOSIS — M25671 Stiffness of right ankle, not elsewhere classified: Secondary | ICD-10-CM | POA: Diagnosis not present

## 2020-11-22 DIAGNOSIS — M6281 Muscle weakness (generalized): Secondary | ICD-10-CM | POA: Diagnosis not present

## 2020-11-22 NOTE — Therapy (Signed)
[facility] [address] [place], [place], [address] Phone: [phone]   Fax:  [phone]  Physical Therapy Treatment  Patient Details  Name: Jamie Huynh MRN: [id number] Date of Birth: 1961-08-14 Referring Provider (PT): Dr. Denyse Amass   Encounter Date: 11/22/2020   PT End of Session - 11/22/20 1656    Visit Number 2    Number of Visits 9    Date for PT Re-Evaluation 12/15/20    Authorization Type [employer]    PT Start Time 1600    PT Stop Time 1642    PT Time Calculation (min) 42 min    Activity Tolerance Patient tolerated treatment well;No increased pain    Behavior During Therapy [facility] for tasks assessed/performed           History reviewed. No pertinent past medical history.  Past Surgical History:  Procedure Laterality Date  . BREAST SURGERY     2 lumpectomies of left breast   . PELVIC LAPAROSCOPY     fibroid   . TONSILLECTOMY      There were no vitals filed for this visit.   Subjective Assessment - 11/22/20 1605    Subjective Pt states the pain is substantially better since last session. She states she has had decreased intensity and frequency of pain. She states she has had decreased "charlie horses" in the leg as well as the ability to now cross the leg over. She states she was able to wear heels on her boots with minor irritation after extended time. She states that heel raises are the only exercise that recreate pain.    Limitations Standing;Walking    How long can you walk comfortably? 1 mile    Patient Stated Goals Pt state s she wants to get back to exercise without pain at all.    Currently in Pain? Yes    Pain Score 2     Pain Location Leg    Pain Orientation Right    Pain Descriptors / Indicators Aching;Sharp    Pain Onset More than a month ago                             [facility] Adult PT Treatment/Exercise - 11/22/20 0001      Ambulation/Gait   Gait Pattern Decreased dorsiflexion -  right;Decreased dorsiflexion - left    Stairs Yes    Stair Management Technique Alternating pattern;One rail Right      Exercises   Exercises Ankle      Manual Therapy   Manual Therapy Soft tissue mobilization;Joint mobilization    Joint Mobilization TC post glide grade II-III R ankle    Soft tissue mobilization R fibularis group muscle bellies      Ankle Exercises: Standing   SLS 30s 3x flat ground    Heel Raises 20 reps;3 seconds    Heel Raises Limitations 2x10 off stair, pain free ROM   avoid end range   Other Standing Ankle Exercises stair step up 6" 2x10; lateral step up 6" 2x10; woodpecker bilat 3s 10x    Other Standing Ankle Exercises Red sidesteps 25 ft 2x at ankle                  PT Education - 11/22/20 1656    Education Details joint protection, anatomy, footwear selection, HEP    Person(s) Educated Patient    Methods Explanation;Demonstration;Handout    Comprehension Verbalized understanding;Returned demonstration  PT Short Term Goals - 11/15/20 1654      PT SHORT TERM GOAL #1   Title Pt will become independent with HEP in order to demonstrate synthesis of PT education.    Time 2    Period Weeks    Status New      PT SHORT TERM GOAL #2   Title Pt will demonstrate ability to maintain SLS for >30s in order to demonstrate functional improvement in ankle motor control.    Time 4    Period Weeks    Status New             PT Long Term Goals - 11/15/20 1656      PT LONG TERM GOAL #1   Title Pt will demonstrate >9 point improvement on LEFS in order to demonstrate clinically significant improvement in LE function.    Time 6    Period Weeks    Status New      PT LONG TERM GOAL #2   Title Pt will be able to demonstrate ability to perform plyometric jumping and landing movements in order to demonstrate functional improvement in R ankle stability and motor control for full return to PLOF.    Time 8    Period Weeks    Status New                  Plan - 11/22/20 1657    Clinical Impression Statement Pt presents with decreased pain, inflammation, and edema around the lateral aspect of the R ankle at today's session. Pt responded well to light STM in order to reduce muscle spasm and was able to introduce low level med-lat ankle stability exercise. Pt required VC for decreasing PF ROM during heel raises in order to reduce max PF that causes combined ankle inversion. Pt still demonstrates strength and stability deficits on R as compared to left with static balance. Pt would benefit from continued skilled therapy in order to maximize functional ankle stability and strength for return to full PLOF.    Personal Factors and Comorbidities Time since onset of injury/illness/exacerbation    Examination-Activity Limitations Squat;Stairs;Stand;Transfers;Other;Lift    Examination-Participation Restrictions Other;Yard Work;Community Activity    Stability/Clinical Decision Making Stable/Uncomplicated    Rehab Potential Good    PT Frequency 1x / week    PT Duration 8 weeks    PT Treatment/Interventions ADLs/Self Care Home Management;Aquatic Therapy;Biofeedback;Electrical Stimulation;Cryotherapy;Iontophoresis 4mg /ml Dexamethasone;Moist Heat;Ultrasound;Gait training;Stair training;Functional mobility training;Therapeutic activities;Therapeutic exercise;Balance training;Neuromuscular re-education;Patient/family education;Orthotic Fit/Training;Manual techniques;Passive range of motion;Dry needling;Taping;Joint Manipulations    PT Next Visit Plan Review HEP, TC posterior mob, STM fibularis, lateral step up, increased 4 way ankle band resistance    PT Home Exercise Plan Handout provided    Consulted and Agree with Plan of Care Patient           Patient will benefit from skilled therapeutic intervention in order to improve the following deficits and impairments:  Decreased balance,Decreased mobility,Difficulty walking,Increased muscle  spasms,Decreased range of motion,Pain,Decreased strength,Decreased activity tolerance  Visit Diagnosis: Ankle stiff, right  Pain in joint of right ankle  Right ankle swelling  Muscle weakness (generalized)     Problem List There are no problems to display for this patient.  PT, DPT 11/22/20 5:02 PM   [facility] [address] [place], [place], [place] Phone: [phone]   Fax:  [phone]  Name: Jamie Huynh MRN: [name] Date of Birth: [birth year]

## 2020-11-22 NOTE — Patient Instructions (Signed)
Access Code: [email or web address] URL: [email or web address] Date: 11/22/2020 Prepared by: Zebedee Iba  Exercises Lateral Step Up - 2 x daily - 7 x weekly - 2 sets - 10 reps Side Stepping with Resistance at Ankles - 1 x daily - 7 x weekly - 1 sets - 3 reps - 46ft hold

## 2020-11-29 ENCOUNTER — Encounter: Payer: Self-pay | Admitting: Physical Therapy

## 2020-11-29 ENCOUNTER — Other Ambulatory Visit: Payer: Self-pay

## 2020-11-29 ENCOUNTER — Ambulatory Visit (INDEPENDENT_AMBULATORY_CARE_PROVIDER_SITE_OTHER): Payer: 59

## 2020-11-29 ENCOUNTER — Ambulatory Visit (INDEPENDENT_AMBULATORY_CARE_PROVIDER_SITE_OTHER): Payer: 59 | Admitting: Physical Therapy

## 2020-11-29 ENCOUNTER — Other Ambulatory Visit: Payer: Self-pay | Admitting: Obstetrics & Gynecology

## 2020-11-29 DIAGNOSIS — M8589 Other specified disorders of bone density and structure, multiple sites: Secondary | ICD-10-CM | POA: Diagnosis not present

## 2020-11-29 DIAGNOSIS — M25471 Effusion, right ankle: Secondary | ICD-10-CM | POA: Diagnosis not present

## 2020-11-29 DIAGNOSIS — Z780 Asymptomatic menopausal state: Secondary | ICD-10-CM

## 2020-11-29 DIAGNOSIS — M25671 Stiffness of right ankle, not elsewhere classified: Secondary | ICD-10-CM

## 2020-11-29 DIAGNOSIS — M85851 Other specified disorders of bone density and structure, right thigh: Secondary | ICD-10-CM

## 2020-11-29 DIAGNOSIS — Z13820 Encounter for screening for osteoporosis: Secondary | ICD-10-CM

## 2020-11-29 DIAGNOSIS — M25571 Pain in right ankle and joints of right foot: Secondary | ICD-10-CM

## 2020-11-29 DIAGNOSIS — M6281 Muscle weakness (generalized): Secondary | ICD-10-CM

## 2020-11-29 NOTE — Therapy (Signed)
[facility] [address] [place], [place], [address] Phone: [phone]   Fax:  [phone]  Physical Therapy Treatment  Patient Details  Name: Jamie Huynh MRN: [id number] Date of Birth: 08-Dec-1960 Referring Provider (PT): Dr. Denyse Amass   Encounter Date: 11/29/2020   PT End of Session - 11/29/20 1821    Visit Number 3    Number of Visits 9    Date for PT Re-Evaluation 12/15/20    Authorization Type [employer] [facility]    PT Start Time 1605    PT Stop Time 1645    PT Time Calculation (min) 40 min    Activity Tolerance Patient tolerated treatment well;No increased pain    Behavior During Therapy [facility] for tasks assessed/performed           History reviewed. No pertinent past medical history.  Past Surgical History:  Procedure Laterality Date  . BREAST SURGERY     2 lumpectomies of left breast   . PELVIC LAPAROSCOPY     fibroid   . TONSILLECTOMY      There were no vitals filed for this visit.   Subjective Assessment - 11/29/20 1601    Subjective Pt states she is really sore today from walking on Sunday. She states the walking on even surface with the brace was uncomfortable. She states that the brace was too tight but she thinks that it may be too small. She reports have significantly increased swelling into the lateral aspect of the foot and pain behind lateral malleolus.    Limitations Standing;Walking    How long can you walk comfortably? 1 mile    Patient Stated Goals Pt state s she wants to get back to exercise without pain at all.    Currently in Pain? Yes    Pain Score 6     Pain Location Leg    Pain Orientation Right    Pain Descriptors / Indicators Aching;Sharp    Pain Type Acute pain    Pain Onset More than a month ago                             [facility] Adult PT Treatment/Exercise - 11/29/20 0001      Ambulation/Gait   Gait Pattern --    Stairs --    Stair Management Technique --      Exercises    Exercises Ankle      Manual Therapy   Manual Therapy Soft tissue mobilization;Joint mobilization    Joint Mobilization TC post glide grade II-III R ankle    Soft tissue mobilization R fibularis group muscle bellies, edema sweeping lateral ankle      Ankle Exercises: Stretches   Soleus Stretch Limitations 30s 2x at stair    Gastroc Stretch 30 seconds    Gastroc Stretch Limitations 2x at stair      Ankle Exercises: Aerobic   Stationary Bike edu re use of bike at home, appropriate parameters and use of cages instead of clips      Ankle Exercises: Standing   SLS 30s 3x flat ground    Heel Raises 20 reps;3 seconds    Heel Raises Limitations 2x10 off stair, pain free ROM   avoid end range   Other Standing Ankle Exercises stair step up 6" 2x10; lateral step up 6" 2x10; woodpecker bilat 3s 10x (on hold)   Other Standing Ankle Exercises Red sidesteps 25 ft 2x at ankle  (hold)  PT Education - 11/29/20 1821    Education Details joint protection, anatomy, footwear selection, HEP, exercise intensity, pacing    Person(s) Educated Patient    Methods Explanation;Demonstration    Comprehension Verbalized understanding;Returned demonstration            PT Short Term Goals - 11/15/20 1654      PT SHORT TERM GOAL #1   Title Pt will become independent with HEP in order to demonstrate synthesis of PT education.    Time 2    Period Weeks    Status New      PT SHORT TERM GOAL #2   Title Pt will demonstrate ability to maintain SLS for >30s in order to demonstrate functional improvement in ankle motor control.    Time 4    Period Weeks    Status New             PT Long Term Goals - 11/15/20 1656      PT LONG TERM GOAL #1   Title Pt will demonstrate >9 point improvement on LEFS in order to demonstrate clinically significant improvement in LE function.    Time 6    Period Weeks    Status New      PT LONG TERM GOAL #2   Title Pt will be able to demonstrate  ability to perform plyometric jumping and landing movements in order to demonstrate functional improvement in R ankle stability and motor control for full return to PLOF.    Time 8    Period Weeks    Status New                 Plan - 11/29/20 1822    Clinical Impression Statement Pt presented to PT session today with increased pain, swelling, and painful gait. This was likely due to spike in physical activity over the weekend. Pt responded well to STM, edema massage, and joint mobilization of R ankle. Localized twitch response was elicited in R fibular group during STM. Pt gave verbal understanding to edu re recovery between exercise sessions, use of thermotherapy, exercise pacing, joint protection, and exercise progression. Due to increased pain and edema, pt's exercise was limited to static movements. Pt would benefit from continued skilled therapy in order to maximize functional ankle stability and strength for return to full PLOF.    Personal Factors and Comorbidities Time since onset of injury/illness/exacerbation    Examination-Activity Limitations Squat;Stairs;Stand;Transfers;Other;Lift    Examination-Participation Restrictions Other;Yard Work;Community Activity    Stability/Clinical Decision Making Stable/Uncomplicated    Rehab Potential Good    PT Frequency 1x / week    PT Duration 8 weeks    PT Treatment/Interventions ADLs/Self Care Home Management;Aquatic Therapy;Biofeedback;Electrical Stimulation;Cryotherapy;Iontophoresis 4mg /ml Dexamethasone;Moist Heat;Ultrasound;Gait training;Stair training;Functional mobility training;Therapeutic activities;Therapeutic exercise;Balance training;Neuromuscular re-education;Patient/family education;Orthotic Fit/Training;Manual techniques;Passive range of motion;Dry needling;Taping;Joint Manipulations    PT Next Visit Plan Review HEP, TC posterior mob, STM fibularis, review woodpecker, heel toe rocking, goblet squat/suitcase squat    PT Home  Exercise Plan Handout provided    Consulted and Agree with Plan of Care Patient           Patient will benefit from skilled therapeutic intervention in order to improve the following deficits and impairments:  Decreased balance,Decreased mobility,Difficulty walking,Increased muscle spasms,Decreased range of motion,Pain,Decreased strength,Decreased activity tolerance  Visit Diagnosis: Ankle stiff, right  Pain in joint of right ankle  Right ankle swelling  Muscle weakness (generalized)     Problem List There are no problems to display for  this patient.  Zebedee Iba PT, DPT 11/29/20 6:27 PM   [place] [facility] [address] [place], [place], [address] Phone: [phone]   Fax:  [phone]  Name: KHRISTA BRAUN MRN: [id number] Date of Birth: [birth year]

## 2020-12-06 ENCOUNTER — Other Ambulatory Visit: Payer: Self-pay

## 2020-12-06 ENCOUNTER — Encounter: Payer: Self-pay | Admitting: Physical Therapy

## 2020-12-06 ENCOUNTER — Ambulatory Visit (INDEPENDENT_AMBULATORY_CARE_PROVIDER_SITE_OTHER): Payer: 59 | Admitting: Physical Therapy

## 2020-12-06 DIAGNOSIS — M25571 Pain in right ankle and joints of right foot: Secondary | ICD-10-CM | POA: Diagnosis not present

## 2020-12-06 DIAGNOSIS — M6281 Muscle weakness (generalized): Secondary | ICD-10-CM

## 2020-12-06 DIAGNOSIS — M25671 Stiffness of right ankle, not elsewhere classified: Secondary | ICD-10-CM | POA: Diagnosis not present

## 2020-12-06 DIAGNOSIS — M25471 Effusion, right ankle: Secondary | ICD-10-CM

## 2020-12-06 NOTE — Therapy (Signed)
[facility] [address] [place], [place], [address] Phone: [phone]   Fax:  [phone]  Physical Therapy Treatment  Patient Details  Name: Jamie Huynh MRN: [id number] Date of Birth: [birth year] Referring Provider (PT): Dr. Denyse Amass   Encounter Date: 12/06/2020   PT End of Session - 12/06/20 1715    Visit Number 4    Number of Visits 9    Date for PT Re-Evaluation 12/15/20    Authorization Type [facility]    PT Start Time 1603    PT Stop Time 1645    PT Time Calculation (min) 42 min    Activity Tolerance Patient tolerated treatment well;No increased pain    Behavior During Therapy [facility] for tasks assessed/performed           History reviewed. No pertinent past medical history.  Past Surgical History:  Procedure Laterality Date  . BREAST SURGERY     2 lumpectomies of left breast   . PELVIC LAPAROSCOPY     fibroid   . TONSILLECTOMY      There were no vitals filed for this visit.   Subjective Assessment - 12/06/20 1608    Subjective Pt states she is doing better and the ankle is not nearly as sore as it was previously. She states she was able to go up and down stairs at a quicker pace and run errands without as much pain. However, she was more sore after a 5 min neighborhood walk.    Limitations Standing;Walking    How long can you walk comfortably? 1 mile    Patient Stated Goals Pt state s she wants to get back to exercise without pain at all.    Currently in Pain? Yes    Pain Score 3     Pain Location Leg    Pain Orientation Right    Pain Descriptors / Indicators Aching;Sharp;Discomfort;Cramping    Pain Type Chronic pain    Pain Onset More than a month ago    Aggravating Factors  standing, walking, stairs, pressure on it    Pain Relieving Factors resting, compression    Multiple Pain Sites No                             [facility] Adult PT Treatment/Exercise - 12/06/20 0001      Exercises    Exercises Ankle      Manual Therapy   Manual Therapy Soft tissue mobilization;Joint mobilization    Joint Mobilization TC post glide grade IV R ankle    Soft tissue mobilization R fibularis group muscle bellies, edema sweeping lateral ankle      Ankle Exercises: Stretches   Soleus Stretch Limitations --    Gastroc Stretch --    Gastroc Stretch Limitations --      Ankle Exercises: Aerobic   Stationary Bike --      Ankle Exercises: Standing   SLS with clock reach 3x5    Heel Raises --    Heel Raises Limitations --   avoid end range   Other Standing Ankle Exercises SL squat from chair height 2x10; DL squat with towel under R 2x10; DL RDL with [id number]    Other Standing Ankle Exercises Red sidesteps 25 ft 2x at ankle                  PT Education - 12/06/20 1714    Education Details joint protection, exercise progression,  footwear selection, HEP, exercise intensity, RPE    Person(s) Educated Patient    Methods Explanation;Demonstration    Comprehension Verbalized understanding;Returned demonstration            PT Short Term Goals - 11/15/20 1654      PT SHORT TERM GOAL #1   Title Pt will become independent with HEP in order to demonstrate synthesis of PT education.    Time 2    Period Weeks    Status New      PT SHORT TERM GOAL #2   Title Pt will demonstrate ability to maintain SLS for >30s in order to demonstrate functional improvement in ankle motor control.    Time 4    Period Weeks    Status New             PT Long Term Goals - 11/15/20 1656      PT LONG TERM GOAL #1   Title Pt will demonstrate >9 point improvement on LEFS in order to demonstrate clinically significant improvement in LE function.    Time 6    Period Weeks    Status New      PT LONG TERM GOAL #2   Title Pt will be able to demonstrate ability to perform plyometric jumping and landing movements in order to demonstrate functional improvement in R ankle stability and motor control  for full return to PLOF.    Time 8    Period Weeks    Status New                 Plan - 12/06/20 1715    Clinical Impression Statement Pt had decreased pain with increased ankle DF mobility after STM and joint mobilization. Pt was able to progress functional strengthening and loading of R ankle at today's session without increased pain. Pt had decreased pain from last session and was able to perform CKC movements with external resistance. Pt still demonstrates increased instabilty at the ankle when loaded but pt has improved recovery and motor control compared to last session. Increased cuing given for form and technique with squatting and RDL. Progress strengthening and stability as tol at next session. Pt would benefit from continued skilled therapy in order to maximize functional ankle stability and strength for return to full PLOF.    Personal Factors and Comorbidities Time since onset of injury/illness/exacerbation    Examination-Activity Limitations Squat;Stairs;Stand;Transfers;Other;Lift    Examination-Participation Restrictions Other;Yard Work;Community Activity    Stability/Clinical Decision Making Stable/Uncomplicated    Rehab Potential Good    PT Frequency 1x / week    PT Duration 8 weeks    PT Treatment/Interventions ADLs/Self Care Home Management;Aquatic Therapy;Biofeedback;Electrical Stimulation;Cryotherapy;Iontophoresis 4mg /ml Dexamethasone;Moist Heat;Ultrasound;Gait training;Stair training;Functional mobility training;Therapeutic activities;Therapeutic exercise;Balance training;Neuromuscular re-education;Patient/family education;Orthotic Fit/Training;Manual techniques;Passive range of motion;Dry needling;Taping;Joint Manipulations    PT Next Visit Plan review HEP, review SL squat from chair, staggered RDL, runner's step up    PT Home Exercise Plan Handout provided    Consulted and Agree with Plan of Care Patient           Patient will benefit from skilled therapeutic  intervention in order to improve the following deficits and impairments:  Decreased balance,Decreased mobility,Difficulty walking,Increased muscle spasms,Decreased range of motion,Pain,Decreased strength,Decreased activity tolerance  Visit Diagnosis: Ankle stiff, right  Pain in joint of right ankle  Right ankle swelling  Muscle weakness (generalized)     Problem List There are no problems to display for this patient.   PT, DPT  12/06/20 5:24 PM   [place] [facility] [address] [place], [place], [address] Phone: [phone]   Fax:  [phone]  Name: Jamie Huynh MRN: [id number] Date of Birth: [birth year]

## 2020-12-06 NOTE — Patient Instructions (Signed)
Access Code: [email or web address] URL: [email or web address] Date: 12/06/2020 Prepared by: Zebedee Iba  Exercises Squat - 1 x daily - 3-4 x weekly - 3 sets - 10 reps Kettlebell Deadlift - 1 x daily - 3-4 x weekly - 3 sets - 10 reps Single Leg Balance with Clock Reach - 1 x daily - 3-4 x weekly - 3 sets - 5 reps

## 2020-12-13 ENCOUNTER — Ambulatory Visit (INDEPENDENT_AMBULATORY_CARE_PROVIDER_SITE_OTHER): Payer: 59 | Admitting: Physical Therapy

## 2020-12-13 ENCOUNTER — Other Ambulatory Visit: Payer: Self-pay

## 2020-12-13 ENCOUNTER — Encounter: Payer: Self-pay | Admitting: Physical Therapy

## 2020-12-13 DIAGNOSIS — M25471 Effusion, right ankle: Secondary | ICD-10-CM | POA: Diagnosis not present

## 2020-12-13 DIAGNOSIS — M25671 Stiffness of right ankle, not elsewhere classified: Secondary | ICD-10-CM | POA: Diagnosis not present

## 2020-12-13 DIAGNOSIS — M6281 Muscle weakness (generalized): Secondary | ICD-10-CM | POA: Diagnosis not present

## 2020-12-13 DIAGNOSIS — M25571 Pain in right ankle and joints of right foot: Secondary | ICD-10-CM

## 2020-12-13 NOTE — Therapy (Signed)
[facility] [address] [place], [place], [address] Phone: [phone]   Fax:  [phone]  Physical Therapy Treatment  Patient Details  Name: Jamie Huynh MRN: [id number] Date of Birth: 02/06/1961 Referring Provider (PT): Dr. Denyse Amass   Encounter Date: 12/13/2020   PT End of Session - 12/13/20 1657    Visit Number 5    Number of Visits 9    Date for PT Re-Evaluation 12/15/20    Authorization Type [facility]    PT Start Time 1602    PT Stop Time 1645    PT Time Calculation (min) 43 min    Activity Tolerance Patient tolerated treatment well;No increased pain    Behavior During Therapy [facility] for tasks assessed/performed           History reviewed. No pertinent past medical history.  Past Surgical History:  Procedure Laterality Date  . BREAST SURGERY     2 lumpectomies of left breast   . PELVIC LAPAROSCOPY     fibroid   . TONSILLECTOMY      There were no vitals filed for this visit.   Subjective Assessment - 12/13/20 1604    Subjective Pt states the ankle is doing better. Each week she is seeing improvement. She has been able to jog up the stairs a little without pain. She was able to squat with some weight as well.    Limitations Standing;Walking    How long can you walk comfortably? 1 mile    Patient Stated Goals Pt state s she wants to get back to exercise without pain at all.    Currently in Pain? No/denies    Pain Score 0-No pain    Pain Onset More than a month ago                             [facility] Adult PT Treatment/Exercise - 12/13/20 0001      Exercises   Exercises Ankle      Manual Therapy   Manual Therapy Soft tissue mobilization;Joint mobilization    Joint Mobilization TC post glide grade IV R ankle    Soft tissue mobilization R fibularis group muscle bellies, edema sweeping lateral ankle      Ankle Exercises: Standing   SLS with clock reach 3x5        Toe Walk (Round Trip) 48ft     Other Standing Ankle Exercises SL squat from chair height 10x; DL squat with towel under R 2x10 DL squat barbell on back 6U44;  DL RDL with [id number] 2V95    Other Standing Ankle Exercises runner's step up 10x                  PT Education - 12/13/20 1656    Education Details acceptable levels of pain, exercise progression, HEP, exercise intensity, POC    Person(s) Educated Patient    Methods Explanation;Demonstration    Comprehension Verbalized understanding;Returned demonstration            PT Short Term Goals - 11/15/20 1654      PT SHORT TERM GOAL #1   Title Pt will become independent with HEP in order to demonstrate synthesis of PT education.    Time 2    Period Weeks    Status New      PT SHORT TERM GOAL #2   Title Pt will demonstrate ability to maintain SLS for >30s in order to demonstrate  functional improvement in ankle motor control.    Time 4    Period Weeks    Status New             PT Long Term Goals - 11/15/20 1656      PT LONG TERM GOAL #1   Title Pt will demonstrate >9 point improvement on LEFS in order to demonstrate clinically significant improvement in LE function.    Time 6    Period Weeks    Status New      PT LONG TERM GOAL #2   Title Pt will be able to demonstrate ability to perform plyometric jumping and landing movements in order to demonstrate functional improvement in R ankle stability and motor control for full return to PLOF.    Time 8    Period Weeks    Status New                 Plan - 12/13/20 1657    Clinical Impression Statement Pt was able to progress CKC strengthening from last session with minimal increase in discomfort. Pt was able to add load to RDL and squat without pain but had med lateral motor control deficits as well as slight increase in discomfort with increased excursion. Increased rest breaks were required between sets. Pt was able to perform an isometric PF hold during toe walks with moderate increase in  pain. Pt gave verbal understanding to edu regarding acceptable levels of pain with HEP and self progression of exercise. Pt to decrease frequency due to improvement. Pt would benefit from continued skilled therapy in order to maximize functional ankle stability and strength for return to full PLOF.    Personal Factors and Comorbidities Time since onset of injury/illness/exacerbation    Examination-Activity Limitations Squat;Stairs;Stand;Transfers;Other;Lift    Examination-Participation Restrictions Other;Yard Work;Community Activity    Stability/Clinical Decision Making Stable/Uncomplicated    Rehab Potential Good    PT Frequency 1x / week    PT Duration 8 weeks    PT Treatment/Interventions ADLs/Self Care Home Management;Aquatic Therapy;Biofeedback;Electrical Stimulation;Cryotherapy;Iontophoresis 4mg /ml Dexamethasone;Moist Heat;Ultrasound;Gait training;Stair training;Functional mobility training;Therapeutic activities;Therapeutic exercise;Balance training;Neuromuscular re-education;Patient/family education;Orthotic Fit/Training;Manual techniques;Passive range of motion;Dry needling;Taping;Joint Manipulations    PT Next Visit Plan review HEP, review SL squat from chair, staggered RDL, runner's step up, star reach SLS    Consulted and Agree with Plan of Care Patient           Patient will benefit from skilled therapeutic intervention in order to improve the following deficits and impairments:  Decreased balance,Decreased mobility,Difficulty walking,Increased muscle spasms,Decreased range of motion,Pain,Decreased strength,Decreased activity tolerance  Visit Diagnosis: Ankle stiff, right  Muscle weakness (generalized)  Right ankle swelling  Pain in joint of right ankle     Problem List There are no problems to display for this patient.   PT, DPT 12/13/20 5:02 PM   [facility] [address] [place], [place], [place] Phone:  [phone]   Fax:  [phone]  Name: Jamie Huynh MRN: Jamie Huynh Date of Birth: May 22, 1961

## 2020-12-20 ENCOUNTER — Encounter: Payer: 59 | Admitting: Physical Therapy

## 2020-12-20 ENCOUNTER — Other Ambulatory Visit: Payer: Self-pay

## 2020-12-20 ENCOUNTER — Ambulatory Visit (INDEPENDENT_AMBULATORY_CARE_PROVIDER_SITE_OTHER): Payer: 59 | Admitting: Family Medicine

## 2020-12-20 ENCOUNTER — Encounter: Payer: Self-pay | Admitting: Family Medicine

## 2020-12-20 VITALS — BP 138/84 | HR 77 | Ht 63.0 in | Wt 134.6 lb

## 2020-12-20 DIAGNOSIS — M7671 Peroneal tendinitis, right leg: Secondary | ICD-10-CM

## 2020-12-20 NOTE — Progress Notes (Signed)
   I, Christoper Fabian, LAT, ATC, am serving as scribe for Dr. Clementeen Graham.  Jamie Huynh is a 60 y.o. 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 Denyse Amass on 11/08/20 and was advised to use Voltaren gel and an ankle compression sleeve.  She was provided a HEP and referred to PT of which she's completed 5 visits. Today, pt reports that she is feeling much better and notes approximately 90% improvement in her symptoms.  She has progressed to attending PT every other week.  Dx imaging: 11/08/20 R ankle XR  Pertinent review of systems: No fevers or chills  Relevant historical information: Hypertension   Exam:  BP 138/84 (BP Location: Left Arm, Patient Position: Sitting, Cuff Size: Normal)   Pulse 77   Ht 5\' 3"  (1.6 m)   Wt 134 lb 9.6 oz (61.1 kg)   SpO2 99%   BMI 23.84 kg/m  General: Well Developed, well nourished, and in no acute distress.   MSK: Right ankle normal-appearing nontender normal motion slight reduced strength to foot eversion   Assessment and Plan: 60 y.o. female with right lateral ankle pain thought to be related to peroneal tendinopathy with partial tear.  Significant improvement with physical therapy.  Plan to complete physical therapy course and proceed to home exercise program.  Discussed precautions.  Recheck back as needed.    Discussed warning signs or symptoms. Please see discharge instructions. Patient expresses understanding.   The above documentation has been reviewed and is accurate and complete 77, M.D.  Total encounter time 20 minutes including face-to-face time with the patient and, reviewing past medical record, and charting on the date of service.   Treatment plan and options into the future.

## 2020-12-20 NOTE — Patient Instructions (Addendum)
Thank you for coming in today.  Continue PT and home exercises.   Recheck with me as needed.   Let me know if you need anything  Medical records: [phone] located at [address].  Please contact them in order to get a copy of your ankle x-ray images.

## 2020-12-27 ENCOUNTER — Other Ambulatory Visit: Payer: Self-pay

## 2020-12-27 ENCOUNTER — Encounter: Payer: Self-pay | Admitting: Physical Therapy

## 2020-12-27 ENCOUNTER — Ambulatory Visit (INDEPENDENT_AMBULATORY_CARE_PROVIDER_SITE_OTHER): Payer: 59 | Admitting: Physical Therapy

## 2020-12-27 DIAGNOSIS — M25471 Effusion, right ankle: Secondary | ICD-10-CM | POA: Diagnosis not present

## 2020-12-27 DIAGNOSIS — M25571 Pain in right ankle and joints of right foot: Secondary | ICD-10-CM

## 2020-12-27 DIAGNOSIS — M25671 Stiffness of right ankle, not elsewhere classified: Secondary | ICD-10-CM

## 2020-12-27 DIAGNOSIS — M6281 Muscle weakness (generalized): Secondary | ICD-10-CM | POA: Diagnosis not present

## 2020-12-27 NOTE — Therapy (Signed)
[facility] [address] [place], [place], [address] Phone: [phone]   Fax:  [phone]  Physical Therapy Treatment/Progress Note  Patient Details  Name: Jamie Huynh MRN: [id number] Date of Birth: [birth year] Referring Provider (PT): Dr. Denyse Amass   Encounter Date: 12/27/2020   PT End of Session - 12/27/20 1829    Visit Number 6    Number of Visits 9    Date for PT Re-Evaluation 12/15/20    Authorization Type [facility]    PT Start Time 1605    PT Stop Time 1644    PT Time Calculation (min) 39 min    Activity Tolerance Patient tolerated treatment well;No increased pain    Behavior During Therapy [facility] for tasks assessed/performed           History reviewed. No pertinent past medical history.  Past Surgical History:  Procedure Laterality Date  . BREAST SURGERY     2 lumpectomies of left breast   . PELVIC LAPAROSCOPY     fibroid   . TONSILLECTOMY      There were no vitals filed for this visit.   Subjective Assessment - 12/27/20 1603    Subjective Pt states she is still improving. She states she has some ankle soreness with bike riding and with "pushing it more." Pain at its worst is still a 3/10. Pt no longer has "charlie horses into the side of the leg."    Limitations Standing;Walking    How long can you walk comfortably? 1 mile    Patient Stated Goals Pt state s she wants to get back to exercise without pain at all.    Currently in Pain? No/denies    Pain Score 0-No pain    Pain Orientation Right    Pain Onset More than a month ago              [facility] PT Assessment - 12/27/20 0001      Assessment   Medical Diagnosis R fibularis strain    Referring Provider (PT) Dr. Denyse Amass    Prior Therapy N/A      Precautions   Precautions None      Restrictions   Weight Bearing Restrictions No      Balance Screen   Has the patient fallen in the past 6 months No      Home Environment   Living Environment Private  residence    Living Arrangements Alone      Prior Function   Level of Independence Independent      Cognition   Overall Cognitive Status Within Functional Limits for tasks assessed      Observation/Other Assessments   Other Surveys  Lower Extremity Functional Scale    Lower Extremity Functional Scale  78.75% Function      Functional Tests   Functional tests Squat;Step up;Single leg stance;Jumping      Squat   Comments DL full WB, no offweighting      Step Up   Comments WFL, decreased ankle instability, able to perform full step up to SL balance      Jumping   Comments unable to perform SL jumping      Single Leg Stance   Comments able to perform with quickened pace during functional step up, no pain      AROM   Overall AROM Comments Plantarflexion WNL, DF limited to -2 deg on R    Right Ankle Inversion 27    Right Ankle Eversion 24  PROM   Overall PROM Comments min limitation into EV and IV, mild discomfort and end range      Strength   Right Ankle Dorsiflexion 5/5    Right Ankle Plantar Flexion 5/5    Right Ankle Inversion 4+/5    Right Ankle Eversion 4/5   n     Palpation   Palpation comment TTP belly of fibularis longus and tib ant      Ambulation/Gait   Gait Pattern Within Functional Limits    Stairs Yes    Stair Management Technique One rail Right                         [facility] Adult PT Treatment/Exercise - 12/27/20 0001      Exercises   Exercises Ankle      Manual Therapy   Manual Therapy Soft tissue mobilization;Joint mobilization    Soft tissue mobilization R fibularis group muscle bellies, lateral gastroc soelus,      Ankle Exercises: Standing   SLS with clock reach 3x5    Other Standing Ankle Exercises DL squat 4M01    Other Standing Ankle Exercises runner's step up 2x10;  standing fwd lunge 2x10 L & R;  curtsy lunge 10x R leg down; side step without band (discussed for home and technique to reduce pain) HR at stair 10x                  PT Education - 12/27/20 1828    Education Details acceptable levels of pain, exercise progression, HEP, footwear for exercise, exercise intensity, POC    Person(s) Educated Patient    Methods Explanation;Demonstration    Comprehension Verbalized understanding;Returned demonstration            PT Short Term Goals - 12/27/20 1842      PT SHORT TERM GOAL #1   Title Pt will become independent with HEP in order to demonstrate synthesis of PT education.    Time 2    Period Weeks    Status Achieved      PT SHORT TERM GOAL #2   Title Pt will demonstrate ability to maintain SLS for >30s in order to demonstrate functional improvement in ankle motor control.    Time 4    Period Weeks    Status Achieved             PT Long Term Goals - 11/15/20 1656      PT LONG TERM GOAL #1   Title Pt will demonstrate >9 point improvement on LEFS in order to demonstrate clinically significant improvement in LE function.    Time 6    Period Weeks    Status Achieved     PT LONG TERM GOAL #2   Title Pt will be able to demonstrate ability to perform plyometric jumping and landing movements in order to demonstrate functional improvement in R ankle stability and motor control for full return to PLOF.    Time 8    Period Weeks    Status Ongoing                Plan - 12/27/20 1829    Clinical Impression Statement Pt demonstrates significant improvement with R LE function as compared to initial eval as demonstrated by functional outcome measure and objective movement screening. Pt has improvement with functional WB, balance, and ROM but continues to have fairly signficant pain with resisted eversion and dynamic ankle stability. Pt was able to introduce  rotational ankle stability and dynamic balance activity without increased pain. Plan to progress towards plyometric loading of ankle as tolerated. Pt would benefit from continued skilled therapy in order to maximize functional  ankle stability and strength for return to full PLOF.    Personal Factors and Comorbidities Time since onset of injury/illness/exacerbation    Examination-Activity Limitations Squat;Stairs;Stand;Transfers;Other;Lift    Examination-Participation Restrictions Other;Yard Work;Community Activity    Stability/Clinical Decision Making Stable/Uncomplicated    Rehab Potential Good    PT Frequency 1x / week    PT Duration 8 weeks    PT Treatment/Interventions ADLs/Self Care Home Management;Aquatic Therapy;Biofeedback;Electrical Stimulation;Cryotherapy;Iontophoresis 4mg /ml Dexamethasone;Moist Heat;Ultrasound;Gait training;Stair training;Functional mobility training;Therapeutic activities;Therapeutic exercise;Balance training;Neuromuscular re-education;Patient/family education;Orthotic Fit/Training;Manual techniques;Passive range of motion;Dry needling;Taping;Joint Manipulations    PT Next Visit Plan review HEP, review SL squat from chair, staggered RDL, runner's step up, star reach SLS    Consulted and Agree with Plan of Care Patient           Patient will benefit from skilled therapeutic intervention in order to improve the following deficits and impairments:  Decreased balance,Decreased mobility,Difficulty walking,Increased muscle spasms,Decreased range of motion,Pain,Decreased strength,Decreased activity tolerance  Visit Diagnosis: Ankle stiff, right  Muscle weakness (generalized)  Right ankle swelling  Pain in joint of right ankle     Problem List There are no problems to display for this patient.   PT, DPT 12/27/20 6:44 PM   [facility] [address] [place], [place], [place] Phone: [phone]   Fax:  [phone]  Name: Jamie Huynh MRN: [name] Date of Birth: December 11, 1960

## 2021-01-03 ENCOUNTER — Encounter: Payer: 59 | Admitting: Physical Therapy

## 2021-01-10 ENCOUNTER — Encounter: Payer: Self-pay | Admitting: Physical Therapy

## 2021-01-10 ENCOUNTER — Ambulatory Visit (INDEPENDENT_AMBULATORY_CARE_PROVIDER_SITE_OTHER): Payer: 59 | Admitting: Physical Therapy

## 2021-01-10 DIAGNOSIS — M25671 Stiffness of right ankle, not elsewhere classified: Secondary | ICD-10-CM

## 2021-01-10 DIAGNOSIS — M6281 Muscle weakness (generalized): Secondary | ICD-10-CM

## 2021-01-10 DIAGNOSIS — M25571 Pain in right ankle and joints of right foot: Secondary | ICD-10-CM | POA: Diagnosis not present

## 2021-01-10 NOTE — Therapy (Signed)
[facility] [address] [place], [place], [address] Phone: [phone]   Fax:  [phone]  Physical Therapy Treatment/Progress Note  Patient Details  Name: Jamie Huynh MRN: [id number] Date of Birth: [birth year] Referring Provider (PT): Dr. Denyse Amass   Encounter Date: 01/10/2021   PT End of Session - 01/10/21 1702    Visit Number 7    Number of Visits 13    Date for PT Re-Evaluation 12/15/20    Authorization Type [facility]    PT Start Time 1613    PT Stop Time 1652    PT Time Calculation (min) 39 min    Activity Tolerance Patient tolerated treatment well;No increased pain    Behavior During Therapy [facility] for tasks assessed/performed           History reviewed. No pertinent past medical history.  Past Surgical History:  Procedure Laterality Date  . BREAST SURGERY     2 lumpectomies of left breast   . PELVIC LAPAROSCOPY     fibroid   . TONSILLECTOMY      Progress note performed in order to update POC.   Subjective Assessment - 01/10/21 1613    Subjective Pt states she did a lot of packing over the weekend and had to go down her stairs multiple times. She did not have pain. However, she rolled her R ankle over the weekend while feeding her birds. She was a in a hurry and stepped on something that caused an inversion. It was puffy and sore but she did not have very much pain following. Pt denies swelling, bruising, or hearing/feeling a pop. Pt also states she feels unable to walk comfortably in heels for work/church/social events.    Limitations Standing;Walking    How long can you walk comfortably? 1 mile    Patient Stated Goals Pt state s she wants to get back to exercise without pain at all.    Currently in Pain? No/denies    Pain Score 0-No pain    Pain Onset More than a month ago              [facility] PT Assessment - 01/10/21 0001      Assessment   Medical Diagnosis R fibularis strain    Referring Provider (PT) Dr.  Denyse Amass    Prior Therapy N/A      Precautions   Precautions None      Restrictions   Weight Bearing Restrictions No      Home Environment   Living Environment Private residence    Living Arrangements Alone      Prior Function   Level of Independence Independent      Cognition   Overall Cognitive Status Within Functional Limits for tasks assessed      Functional Tests   Functional tests Squat;Step up;Single leg stance;Jumping      Squat   Comments DL full WB, no offweighting      Step Up   Comments WFL, decreased ankle instability, able to perform full step up to SL balance      Jumping   Comments unable to perform SL jumping      Single Leg Stance   Comments able to perform with quickened pace during functional step up, no pain      AROM (Previous PN)   Overall AROM Comments Plantarflexion WNL, DF limited to -2 deg on R    Right Ankle Inversion 27    Right Ankle Eversion 24  PROM (Previous PN)   Overall PROM Comments min limitation into EV and IV, mild discomfort and end range      Strength (Previous PN)   Right Ankle Dorsiflexion 5/5    Right Ankle Plantar Flexion 5/5    Right Ankle Inversion 4+/5    Right Ankle Eversion 4/5      Palpation   Palpation comment TTP belly of fibularis longus, gastroc, ATFL      Anterior Drawer Test and Talar Tilt Test   Findings Negative                         [facility] Adult PT Treatment/Exercise - 01/10/21 0001      Ambulation/Gait   Gait Pattern Within Functional Limits    Stairs Yes    Stair Management Technique One rail Right      Exercises   Exercises Ankle                   Ankle Exercises: Standing   SLS on pliant surface 30s 3x    Other Standing Ankle Exercises DL squat 2T55 on bosu    Other Standing Ankle Exercises runner's step up to Airex 10x; curtsy lunge 10x R leg down;      Ankle Exercises: Supine   Other Supine Ankle Exercises self massage foam roller 3 min                   PT Education - 01/10/21 1701    Education Details acceptable levels of pain, exercise progression, HEP, exercise intensity, POC, footwear, biomechanics    Person(s) Educated Patient    Methods Explanation;Demonstration;Tactile cues;Verbal cues    Comprehension Verbalized understanding;Returned demonstration;Verbal cues required            PT Short Term Goals - 12/27/20 1842      PT SHORT TERM GOAL #1   Title Pt will become independent with HEP in order to demonstrate synthesis of PT education.    Time 2    Period Weeks    Status Achieved      PT SHORT TERM GOAL #2   Title Pt will demonstrate ability to maintain SLS for >30s in order to demonstrate functional improvement in ankle motor control.    Time 4    Period Weeks    Status Achieved             PT Long Term Goals - 12/27/20 1842      PT LONG TERM GOAL #1   Title Pt will demonstrate >9 point improvement on LEFS in order to demonstrate clinically significant improvement in LE function.    Time 6    Period Weeks    Status Achieved      PT LONG TERM GOAL #2   Title Pt will be able to demonstrate ability to perform plyometric jumping and landing movements in order to demonstrate functional improvement in R ankle stability and motor control for full return to PLOF.    Time 8    Period Weeks    Status On-going                 Plan - 01/10/21 1701    Clinical Impression Statement Pt continues to demonstrate improvement with R LE function as compared to initial eval. Due to report of recent inversion sprain, clinical testing does not suggest full thickness ligamentous damage, likely grade I strain of ATFL. Pt was able to tolerate progressive ankle stability  exercise to include Bosu ball squatting and runner's stance to pliant foam surface. Pt still demonstrates signifcant ML ankle instability but did not increase pain with exercise progression. Strength and motor control are improving on R ankle.  Plan to progress towards plyometric loading of ankle as tolerated. Pt would benefit from continued skilled therapy in order to maximize functional ankle stability and strength for return to full PLOF.    Personal Factors and Comorbidities Time since onset of injury/illness/exacerbation    Examination-Activity Limitations Squat;Stairs;Stand;Transfers;Other;Lift    Examination-Participation Restrictions Other;Yard Work;Community Activity    Stability/Clinical Decision Making Stable/Uncomplicated    Rehab Potential Good    PT Frequency Other (comment)   Once every 2-3 weeks for 6 weeks   PT Duration --    PT Treatment/Interventions ADLs/Self Care Home Management;Aquatic Therapy;Biofeedback;Electrical Stimulation;Cryotherapy;Iontophoresis 4mg /ml Dexamethasone;Moist Heat;Ultrasound;Gait training;Stair training;Functional mobility training;Therapeutic activities;Therapeutic exercise;Balance training;Neuromuscular re-education;Patient/family education;Orthotic Fit/Training;Manual techniques;Passive range of motion;Dry needling;Taping;Joint Manipulations    PT Next Visit Plan review HEP, review SL squat from chair, staggered RDL, runner's step up, star reach SLS    Consulted and Agree with Plan of Care Patient           Patient will benefit from skilled therapeutic intervention in order to improve the following deficits and impairments:  Decreased balance,Decreased mobility,Difficulty walking,Increased muscle spasms,Decreased range of motion,Pain,Decreased strength,Decreased activity tolerance  Visit Diagnosis: Ankle stiff, right  Muscle weakness (generalized)  Pain in joint of right ankle     Problem List There are no problems to display for this patient.   PT, DPT 01/10/21 5:09 PM   [facility] [address] [place], [place], [place] Phone: [phone]   Fax:  [phone]  Name: Jamie Huynh MRN: [name] Date of Birth:  04-Aug-1961

## 2021-01-24 ENCOUNTER — Ambulatory Visit (INDEPENDENT_AMBULATORY_CARE_PROVIDER_SITE_OTHER): Payer: 59 | Admitting: Physical Therapy

## 2021-01-24 ENCOUNTER — Other Ambulatory Visit: Payer: Self-pay

## 2021-01-24 ENCOUNTER — Encounter: Payer: Self-pay | Admitting: Physical Therapy

## 2021-01-24 DIAGNOSIS — M25571 Pain in right ankle and joints of right foot: Secondary | ICD-10-CM | POA: Diagnosis not present

## 2021-01-24 DIAGNOSIS — M6281 Muscle weakness (generalized): Secondary | ICD-10-CM

## 2021-01-24 DIAGNOSIS — M25671 Stiffness of right ankle, not elsewhere classified: Secondary | ICD-10-CM | POA: Diagnosis not present

## 2021-01-24 NOTE — Therapy (Signed)
[facility] [address] [place], [place], [address] Phone: [phone]   Fax:  [phone]  Physical Therapy Treatment  Patient Details  Name: Jamie Huynh MRN: [id number] Date of Birth: 10/04/1960 Referring Provider (PT): Dr. Denyse Amass   Encounter Date: 01/24/2021   PT End of Session - 01/24/21 1700    Visit Number 8    Number of Visits 13    Date for PT Re-Evaluation 02/23/21    Authorization Type [facility]    PT Start Time 1608    PT Stop Time 1649    PT Time Calculation (min) 41 min    Activity Tolerance Patient tolerated treatment well;No increased pain    Behavior During Therapy [facility] for tasks assessed/performed           No past medical history on file.  Past Surgical History:  Procedure Laterality Date  . BREAST SURGERY     2 lumpectomies of left breast   . PELVIC LAPAROSCOPY     fibroid   . TONSILLECTOMY      There were no vitals filed for this visit.   Subjective Assessment - 01/24/21 1609    Subjective Pt states the ankle is a little stiff and achey today from the walking ~0.68miles. She did a lot of packing and moving over the weekend and did not have increased pain. Pt states there is an "awkward feeling" while walking on R but it has gotten significantly better.    Limitations Standing;Walking    How long can you walk comfortably? 1 mile    Patient Stated Goals Pt state s she wants to get back to exercise without pain at all.    Currently in Pain? No/denies    Pain Score 0-No pain    Pain Onset More than a month ago                             [facility] Adult PT Treatment/Exercise - 01/24/21 0001      Ambulation/Gait   Gait Pattern Within Functional Limits    Stairs Yes    Stair Management Technique One rail Right      Exercises   Exercises Ankle      Manual Therapy   Manual Therapy Soft tissue mobilization    Joint Mobilization kneeling post TC glide grade IV    Soft tissue  mobilization R fibularis group      Ankle Exercises: Standing   SLS on pliant surface 30s 4x    Other Standing Ankle Exercises DL squat 6E33 on bosu    Other Standing Ankle Exercises runner's step up 2x10 onto toes with 2s hold;  Step up with 10lbs each hand 15x; side stepping 33ft YTB at ankle; curtsy lunge 10x      Ankle Exercises: Supine   Other Supine Ankle Exercises self massage foam roller (discussed self STM for home)                 PT Education - 01/24/21 1654    Education Details exercise progression, HEP, appropriate gym exercises, ankle stability needs, POC    Person(s) Educated Patient    Methods Explanation;Demonstration;Tactile cues;Verbal cues    Comprehension Verbalized understanding;Returned demonstration;Verbal cues required;Tactile cues required            PT Short Term Goals - 12/27/20 1842      PT SHORT TERM GOAL #1   Title Pt will become independent with  HEP in order to demonstrate synthesis of PT education.    Time 2    Period Weeks    Status Achieved      PT SHORT TERM GOAL #2   Title Pt will demonstrate ability to maintain SLS for >30s in order to demonstrate functional improvement in ankle motor control.    Time 4    Period Weeks    Status Achieved             PT Long Term Goals - 12/27/20 1842      PT LONG TERM GOAL #1   Title Pt will demonstrate >9 point improvement on LEFS in order to demonstrate clinically significant improvement in LE function.    Time 6    Period Weeks    Status Achieved      PT LONG TERM GOAL #2   Title Pt will be able to demonstrate ability to perform plyometric jumping and landing movements in order to demonstrate functional improvement in R ankle stability and motor control for full return to PLOF.    Time 8    Period Weeks    Status On-going                 Plan - 01/24/21 1701    Clinical Impression Statement Pt demonstrates improvement with sagittal plane strength and control but has  noticeable frontal plane instability and motor control deficits with pliant surface training. However, pt is now able to tolerate direction ML direction exercise and push off on flat ground without increased pain. Pt has low level baseline discomfort with ML exercise that is not more than 2 on pain scale. Pt's HEP updated to include more ankle stability work in preparation for D/C due to upcoming travel. Pt would benefit from continued skilled therapy in order to maximize functional ankle stability and strength for return to full PLOF.    Personal Factors and Comorbidities Time since onset of injury/illness/exacerbation    Examination-Activity Limitations Squat;Stairs;Stand;Transfers;Other;Lift    Examination-Participation Restrictions Other;Yard Work;Community Activity    Stability/Clinical Decision Making Stable/Uncomplicated    Rehab Potential Good    PT Frequency Other (comment)   Once every 2-3 weeks for 6 weeks   PT Treatment/Interventions ADLs/Self Care Home Management;Aquatic Therapy;Biofeedback;Electrical Stimulation;Cryotherapy;Iontophoresis 4mg /ml Dexamethasone;Moist Heat;Ultrasound;Gait training;Stair training;Functional mobility training;Therapeutic activities;Therapeutic exercise;Balance training;Neuromuscular re-education;Patient/family education;Orthotic Fit/Training;Manual techniques;Passive range of motion;Dry needling;Taping;Joint Manipulations    Consulted and Agree with Plan of Care Patient           Patient will benefit from skilled therapeutic intervention in order to improve the following deficits and impairments:  Decreased balance,Decreased mobility,Difficulty walking,Increased muscle spasms,Decreased range of motion,Pain,Decreased strength,Decreased activity tolerance  Visit Diagnosis: Ankle stiff, right  Muscle weakness (generalized)  Pain in joint of right ankle     Problem List There are no problems to display for this patient.   PT, DPT 01/24/21  5:08 PM   [facility] [address] [place], [place], [place] Phone: [phone]   Fax:  [phone]  Name: Jamie Huynh MRN: [name] Date of Birth: Feb 28, 1961

## 2021-02-13 ENCOUNTER — Other Ambulatory Visit: Payer: Self-pay

## 2021-02-13 ENCOUNTER — Encounter: Payer: Self-pay | Admitting: Physical Therapy

## 2021-02-13 ENCOUNTER — Ambulatory Visit (INDEPENDENT_AMBULATORY_CARE_PROVIDER_SITE_OTHER): Payer: 59 | Admitting: Physical Therapy

## 2021-02-13 DIAGNOSIS — M6281 Muscle weakness (generalized): Secondary | ICD-10-CM

## 2021-02-13 DIAGNOSIS — M25671 Stiffness of right ankle, not elsewhere classified: Secondary | ICD-10-CM | POA: Diagnosis not present

## 2021-02-13 DIAGNOSIS — M25471 Effusion, right ankle: Secondary | ICD-10-CM

## 2021-02-13 DIAGNOSIS — M25571 Pain in right ankle and joints of right foot: Secondary | ICD-10-CM

## 2021-02-13 NOTE — Therapy (Signed)
[facility] [address] [place], [place], [address] Phone: [phone]   Fax:  [phone]  Physical Therapy Discharge   Patient Details  Name: Jamie Huynh MRN: [id number] Date of Birth: 06-02-1961 Referring Provider (PT): Dr. Georgina Snell   Encounter Date: 02/13/2021   PT End of Session - 02/13/21 1017    Visit Number 9    Number of Visits 13    Date for PT Re-Evaluation 02/23/21    Authorization Type [facility]    PT Start Time 1015    PT Stop Time 1043    PT Time Calculation (min) 28 min    Activity Tolerance Patient tolerated treatment well;No increased pain    Behavior During Therapy [facility] for tasks assessed/performed           History reviewed. No pertinent past medical history.  Past Surgical History:  Procedure Laterality Date  . BREAST SURGERY     2 lumpectomies of left breast   . PELVIC LAPAROSCOPY     fibroid   . TONSILLECTOMY      There were no vitals filed for this visit.   Subjective Assessment - 02/13/21 1017    Subjective Pt states the ankle feels "really good." She states she has been able to walk for multiple loops around [facility] and has been doing a lot more recently. She states that heels are still tough to wear and walk around in. She still feels a little discomfort along the side of the R side of the foot after a longer evening out in heels but she was able to run up the stairs yesterday without pain.    Limitations Standing;Walking    How long can you walk comfortably? 1 mile    Patient Stated Goals Pt state s she wants to get back to exercise without pain at all.    Currently in Pain? No/denies    Pain Score 0-No pain    Pain Onset More than a month ago              [facility] PT Assessment - 02/13/21 0001      Assessment   Medical Diagnosis R fibularis strain    Referring Provider (PT) Dr. Georgina Snell    Prior Therapy N/A      Precautions   Precautions None      Restrictions   Weight Bearing  Restrictions No      Balance Screen   Has the patient fallen in the past 6 months No      [place] residence    Living Arrangements Alone      Prior Function   Level of Independence Independent      Cognition   Overall Cognitive Status Within Functional Limits for tasks assessed      Observation/Other Assessments   Lower Extremity Functional Scale  88%      Functional Tests   Functional tests Squat;Step up;Single leg stance;Jumping      Squat   Comments DL full WB, WFL      Step Up   Comments WFL      Jumping   Comments able to perform DL hopping >30s     Single Leg Stance   Comments WFL      AROM   Overall AROM Comments Plantarflexion WNL, DF limited to -2 deg on R      Strength   Right Ankle Dorsiflexion 5/5    Right Ankle Plantar Flexion 5/5  Right Ankle Inversion 4+/5    Right Ankle Eversion 4+/5      Palpation   Palpation comment TTP belly of fibularis longus, gastroc, ATFL                         [facility] Adult PT Treatment/Exercise - 02/13/21 0001                                   HEP reviewed and discussed for home/self progression              Ankle Exercises: Standing   SLS on pliant surface 30s 3x    Other Standing Ankle Exercises DL squat 3x10 on bosu    Other Standing Ankle Exercises runner's step up 2x10 onto toes with 2s hold; side stepping 53f YTB at ankle  Ankle EV YTB 2x10                          PT Education - 02/13/21 1059    Education Details self exercise progression, HEP, appropriate gym exercises, ankle stability needs, light plyometrics, POC, exam findings, exam findings, walking program plans, D/C    Person(s) Educated Patient    Methods Explanation;Demonstration;Tactile cues;Verbal cues    Comprehension Verbalized understanding;Returned demonstration;Verbal cues required;Tactile cues required            PT Short Term Goals - 12/27/20 1842      PT  SHORT TERM GOAL #1   Title Pt will become independent with HEP in order to demonstrate synthesis of PT education.    Time 2    Period Weeks    Status Achieved      PT SHORT TERM GOAL #2   Title Pt will demonstrate ability to maintain SLS for >30s in order to demonstrate functional improvement in ankle motor control.    Time 4    Period Weeks    Status Achieved             PT Long Term Goals - 02/13/21 1046      PT LONG TERM GOAL #1   Title Pt will demonstrate >9 point improvement on LEFS in order to demonstrate clinically significant improvement in LE function.    Time 6    Period Weeks    Status Achieved      PT LONG TERM GOAL #2   Title Pt will be able to demonstrate ability to perform light plyometric jumping and landing movements in order to demonstrate functional improvement in R ankle stability and motor control for full return to PLOF.    Time 8    Period Weeks    Status Achieved                 Plan - 02/13/21 1100    Clinical Impression Statement Pt demonstrates significant improvement in R ankle strength and function as demonstrated by movement assessment and outcome measure. Pt still with mild strength deficits with full BW loaded activity, but has improved ability to perform light plyometric loading without aggravating pain or symptoms. Pt plans to be out of town for the next 2 months, but is aware and understands self progression for exercise and graded exposure to activity. Pt has met PT goals and to be D/C at this time.    Personal Factors and Comorbidities Time since onset of injury/illness/exacerbation    Examination-Activity  Limitations Squat;Stairs;Stand;Transfers;Other;Lift    Examination-Participation Restrictions Other;Yard Work;Community Activity    Stability/Clinical Decision Making Stable/Uncomplicated    Rehab Potential Good    PT Frequency Other (comment)   Once every 2-3 weeks for 6 weeks   PT Treatment/Interventions ADLs/Self Care Home  Management;Aquatic Therapy;Biofeedback;Electrical Stimulation;Cryotherapy;Iontophoresis 45m/ml Dexamethasone;Moist Heat;Ultrasound;Gait training;Stair training;Functional mobility training;Therapeutic activities;Therapeutic exercise;Balance training;Neuromuscular re-education;Patient/family education;Orthotic Fit/Training;Manual techniques;Passive range of motion;Dry needling;Taping;Joint Manipulations    Consulted and Agree with Plan of Care Patient           Patient will benefit from skilled therapeutic intervention in order to improve the following deficits and impairments:  Decreased balance,Decreased mobility,Difficulty walking,Increased muscle spasms,Decreased range of motion,Pain,Decreased strength,Decreased activity tolerance  Visit Diagnosis: Ankle stiff, right  Muscle weakness (generalized)  Pain in joint of right ankle  Right ankle swelling     Problem List There are no problems to display for this patient.   ADaleen BoPT, DPT 02/13/21 11:15 AM   [place] [place] [address]Phone: [phone]  Fax:  [phone] Name: Jamie LAVALLEEMRN: [id number] of Birth: 905-16-1962   PHYSICAL THERAPY DISCHARGE SUMMARY  Visits from Start of Care: 9    Plan: Patient agrees to discharge.  Patient goals were met. Patient is being discharged due to meeting the stated rehab goals.  ?????

## 2021-05-03 ENCOUNTER — Ambulatory Visit (INDEPENDENT_AMBULATORY_CARE_PROVIDER_SITE_OTHER): Payer: 59 | Admitting: Obstetrics & Gynecology

## 2021-05-03 ENCOUNTER — Other Ambulatory Visit: Payer: Self-pay

## 2021-05-03 ENCOUNTER — Encounter: Payer: Self-pay | Admitting: Obstetrics & Gynecology

## 2021-05-03 VITALS — BP 116/74 | HR 71 | Resp 16 | Wt 127.0 lb

## 2021-05-03 DIAGNOSIS — N951 Menopausal and female climacteric states: Secondary | ICD-10-CM

## 2021-05-03 DIAGNOSIS — Z803 Family history of malignant neoplasm of breast: Secondary | ICD-10-CM | POA: Diagnosis not present

## 2021-05-03 NOTE — Progress Notes (Signed)
    Jamie Huynh Aug 23, 1961 [id number]        60 y.o.  G7P0070   RP: Postmenopausal for counseling on HRT  HPI: Postmenopausal for many years and was well on no HRT.  Started HRT through [facility] mainly for thinning of hair.  Fam Hx is positive for Breast Ca in mother, maternal GM and aunts.   OB History  Gravida Para Term Preterm AB Living  7 0     7    SAB IAB Ectopic Multiple Live Births  7            # Outcome Date GA Lbr Len/2nd Weight Sex Delivery Anes PTL Lv  7 SAB           6 SAB           5 SAB           4 SAB           3 SAB           2 SAB           1 SAB             Past medical history,surgical history, problem list, medications, allergies, family history and social history were all reviewed and documented in the EPIC chart.   Directed ROS with pertinent positives and negatives documented in the history of present illness/assessment and plan.  Exam:  Vitals:   05/03/21 1614  BP: 116/74  Pulse: 71  Resp: 16  Weight: 127 lb (57.6 kg)   General appearance:  Normal  Gyn exam deferred   Assessment/Plan:  60 y.o. G7P0070   1. Menopausal syndrome Counseling on hormone replacement therapy especially when starting many years after the beginning of menopause now at age 3.  The risk of blood clots including strokes and pulmonary embolism reviewed.  Patient has a strong family of breast cancer involving her mother, maternal grandmother and maternal aunts.  Also discussed that her hair loss is probably not due to her low estrogen levels.  Therefore decision to stop HRT.  2. Family history of breast cancer Mother, maternal grandmother, maternal aunts.  Other orders - Fluocinolone Acetonide 0.01 % OIL; SMARTSIG:2-3 Drop(s) In Ear(s) Daily - TRI-LUMA 0.01-4-0.05 % CREA; Apply topically at bedtime. (Patient not taking: Reported on 05/03/2021) - finasteride (PROSCAR) 5 MG tablet; Take 2.5 mg by mouth daily. (Patient not taking: Reported on  05/03/2021) - estradiol (CLIMARA - DOSED IN MG/24 HR) 0.025 mg/24hr patch; Place 0.025 mg onto the skin 2 (two) times a week. - progesterone (PROMETRIUM) 100 MG capsule; Take 100 mg by mouth daily.   [name], 4:46 PM 05/03/2021

## 2021-05-07 ENCOUNTER — Encounter: Payer: Self-pay | Admitting: Obstetrics & Gynecology

## 2022-05-15 IMAGING — DX DG ANKLE COMPLETE 3+V*R*
3 series · 3 of 3 positions shown · non-contrast
Comparison: None.

CLINICAL DATA: Right ankle pain.  X 18 months.

EXAM:
RIGHT ANKLE - COMPLETE 3+ VIEW

[ankle ap]
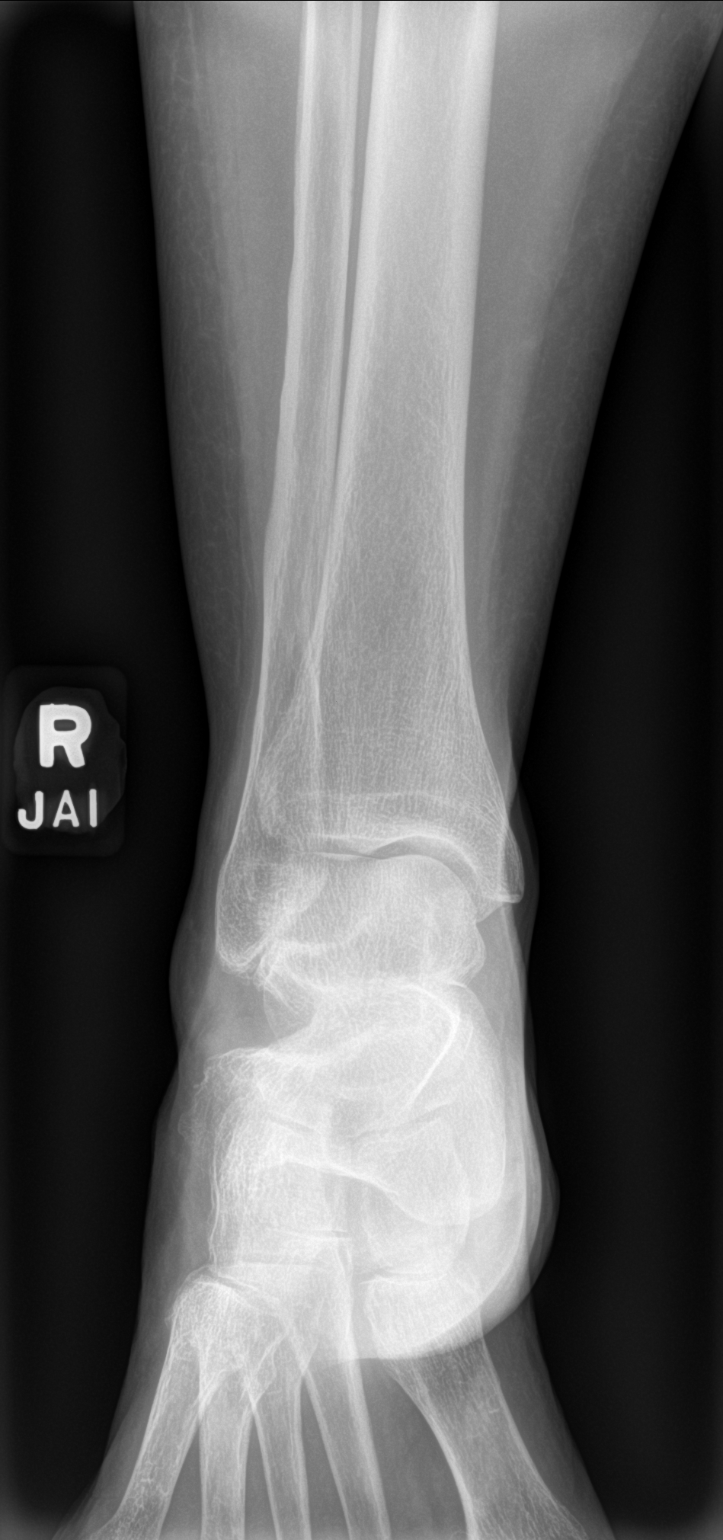

[ankle obl]
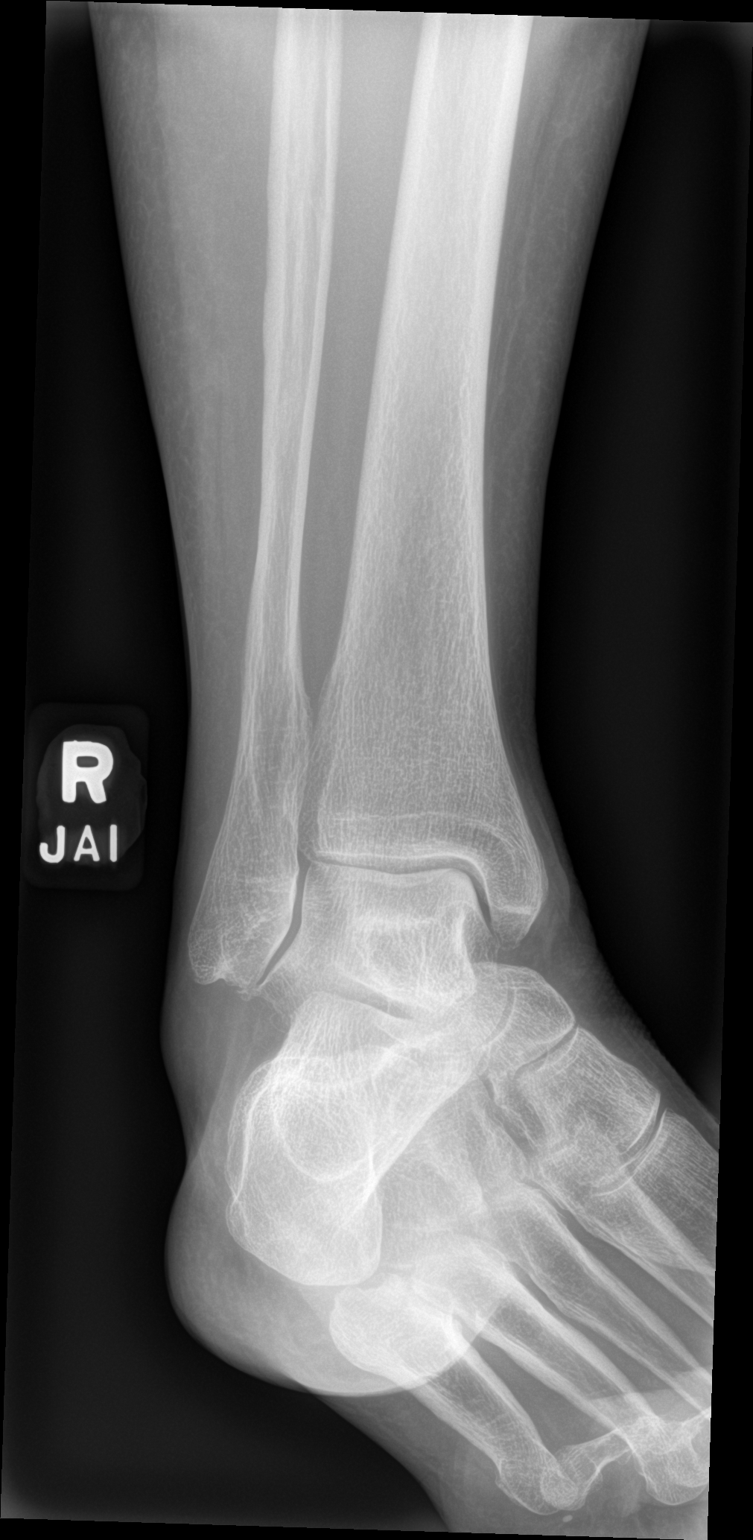

[ankle lat]
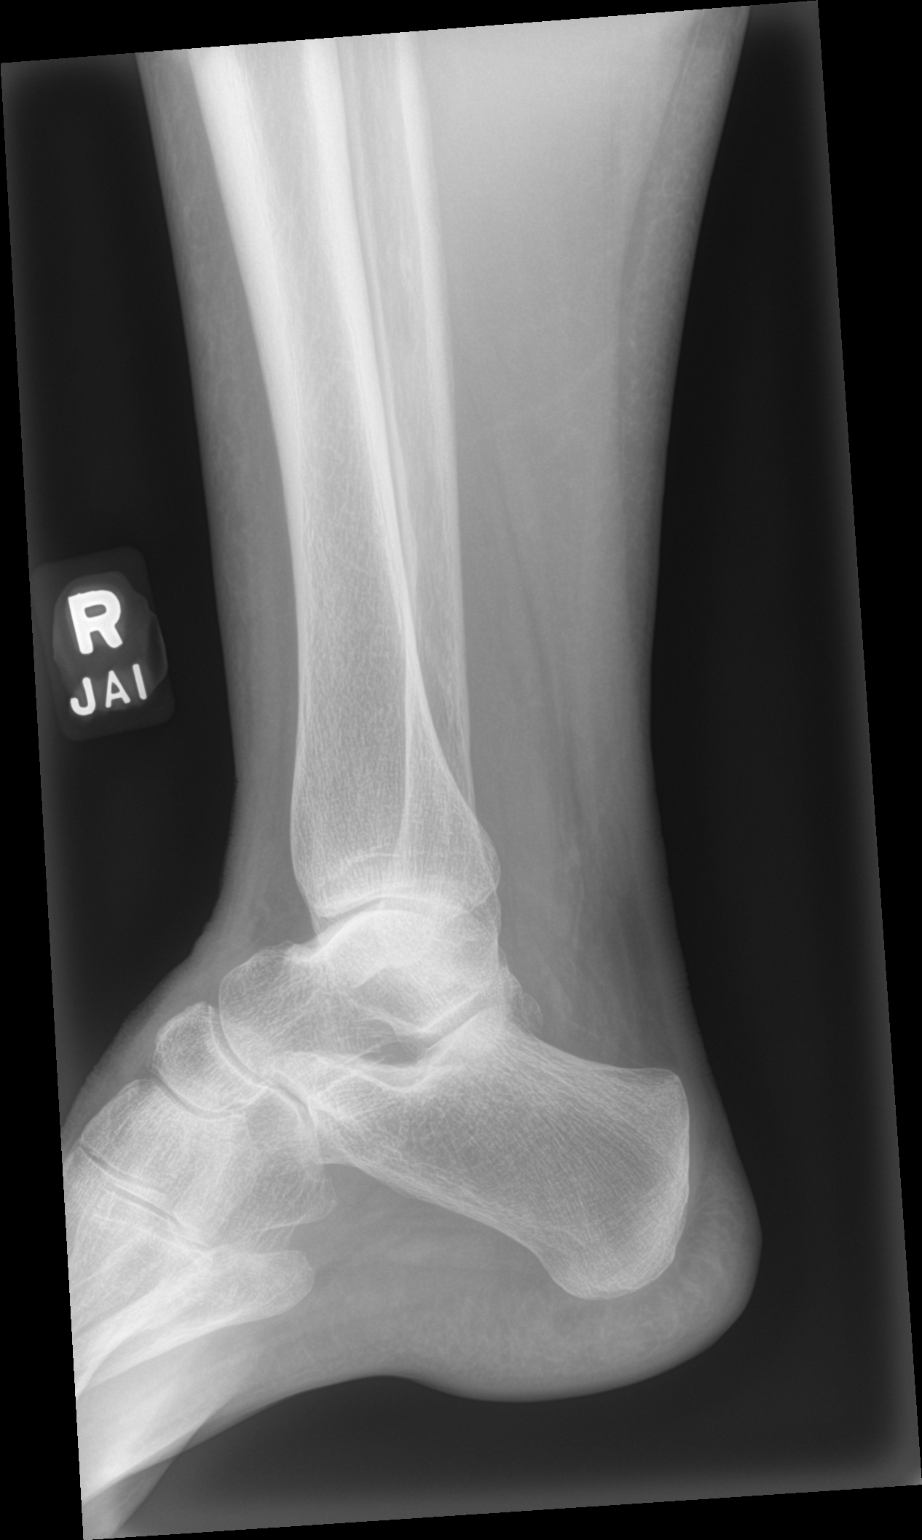

[3 of 3 positions shown; findings below may reference images not displayed]

FINDINGS: Suggestion of an old distal fibular avulsion fracture. Cortical
irregularity of the adjacent talus suggestive of an old talar
fracture. There is no evidence of fracture, dislocation, or joint
effusion. There is no evidence of arthropathy or other focal bone
abnormality. Soft tissues are unremarkable.
IMPRESSION: No acute displaced fracture or dislocation. Suggestion of old talar
and distal fibular fractures.

## 2023-02-08 ENCOUNTER — Telehealth: Payer: Self-pay

## 2023-02-08 NOTE — Telephone Encounter (Signed)
Patient is needing her x ray and [place] images made into a cd with the results mailed to [place] in [place]. Address is correct in EPIC

## 2023-02-08 NOTE — Telephone Encounter (Signed)
CD made for patient.  I left a vmail that this will go out in mail next week to address on system, as requested.

## 2023-02-11 NOTE — Telephone Encounter (Signed)
CD placed in outgoing mail.
